# Patient Record
Sex: Female | Born: 1940
Health system: Southern US, Community
[De-identification: ages and names within clinical notes are randomized; demographics above are authoritative.]

## PROBLEM LIST (undated history)

## (undated) DIAGNOSIS — M8580 Other specified disorders of bone density and structure, unspecified site: Secondary | ICD-10-CM

## (undated) DIAGNOSIS — I10 Essential (primary) hypertension: Secondary | ICD-10-CM

## (undated) DIAGNOSIS — A310 Pulmonary mycobacterial infection: Secondary | ICD-10-CM

## (undated) HISTORY — DX: Essential (primary) hypertension: I10

## (undated) HISTORY — DX: Pulmonary mycobacterial infection: A31.0

## (undated) HISTORY — DX: Other specified disorders of bone density and structure, unspecified site: M85.80

---

## 1989-04-24 HISTORY — PX: OTHER SURGICAL HISTORY: SHX169

## 1996-08-24 HISTORY — PX: VESICOVAGINAL FISTULA CLOSURE W/ TAH: SUR271

## 2011-06-12 ENCOUNTER — Encounter: Payer: Self-pay | Admitting: Internal Medicine

## 2011-06-12 ENCOUNTER — Ambulatory Visit (INDEPENDENT_AMBULATORY_CARE_PROVIDER_SITE_OTHER): Payer: Medicare Other | Admitting: Internal Medicine

## 2011-06-12 DIAGNOSIS — A310 Pulmonary mycobacterial infection: Secondary | ICD-10-CM

## 2011-06-12 DIAGNOSIS — J479 Bronchiectasis, uncomplicated: Secondary | ICD-10-CM

## 2011-06-12 NOTE — Progress Notes (Signed)
06/12/11- 78 yoF never smoker here with husband on kind referral by Dr.Nnodi/ [facility].  She she had an episode of cough with hemoptysis in January of 2011 and was diagnosed with Mycobacterium avium. Her pulmonologist in [place] followed her conservatively. There was another brief episode of self-limited hemoptysis in March of 2012. She has had bronchoscopy x2. CT chest without contrast on 02/15/2011 showed areas of reticular nodularity and scarring in the right middle lobe, lingula and right lower lobe, unchanged from prior examination back to 08/27/2009. Bronchiectasis was noted in the right middle lobe and lingula. Stable reticular interstitial opacity is noted in the anterior segment of the right upper lobe. She denies night sweats, fever, weight loss or routine cough. In the absence of ongoing symptoms or radiologic progression, the decision was made to follow without medication. They have moved to [place] to close to family. Past medical history is significant for a couple of episodes of bronchitis with colds and remote history of active reflux requiring treatment but currently inactive. She has never had pneumonia. Treated for high blood pressure. 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 Never a regular smoker but admits smoking a cigarette very occasionally on a social basis.  ROS-see HPI Constitutional:   No-   weight loss, night sweats, fevers, chills, fatigue, lassitude. HEENT:   No-  headaches, difficulty swallowing, tooth/dental problems, sore throat,       No-  sneezing, itching, ear ache, nasal congestion, post nasal drip,  CV:  No-   chest pain, orthopnea, PND, swelling in lower extremities, anasarca, dizziness, palpitations Resp: No-   shortness of breath with exertion or at rest.              No-   productive cough,  No non-productive cough,  No- coughing up of blood.              No-   change in color of mucus.  No-  wheezing.   Skin: No-   rash or lesions. GI:  No-   heartburn, indigestion, abdominal pain, nausea, vomiting, diarrhea,                 change in bowel habits, loss of appetite GU: No-   dysuria, change in color of urine, no urgency or frequency.  No- flank pain. MS:  No-   joint pain or swelling.  No- decreased range of motion.  No- back pain. Neuro-     nothing unusual Psych:  No- change in mood or affect. No depression or anxiety.  No memory loss.  OBJ General- Alert, Oriented, Affect-appropriate, Distress- none acute; medium build Skin- rash-none, lesions- none, excoriation- none Lymphadenopathy- none Head- atraumatic            Eyes- Gross vision intact, PERRLA, conjunctivae clear secretions            Ears- Hearing, canals-normal            Nose- Clear, no-Septal dev, mucus, polyps, erosion, perforation             Throat- Mallampati II , mucosa clear , drainage- none, tonsils- atrophic Neck- flexible , trachea midline, no stridor , thyroid nl, carotid no bruit Chest - symmetrical excursion , unlabored           Heart/CV- RRR , no murmur , no gallop  , no rub, nl s1 s2                           -  JVD- none , edema- none, stasis changes- none, varices- none           Lung- clear to P&A, wheeze- none, cough- none , dullness-none, rub- none           Chest wall-  Abd- tender-no, distended-no, bowel sounds-present, HSM- no Br/ Gen/ Rectal- Not done, not indicated Extrem- cyanosis- none, clubbing, none, atrophy- none, strength- nl Neuro- grossly intact to observation

## 2011-06-12 NOTE — Patient Instructions (Signed)
Order- schedule PFT  We will request your pulmonologist's records from [place]

## 2011-06-14 ENCOUNTER — Encounter: Payer: Self-pay | Admitting: Internal Medicine

## 2011-06-14 DIAGNOSIS — J479 Bronchiectasis, uncomplicated: Secondary | ICD-10-CM | POA: Insufficient documentation

## 2011-06-14 DIAGNOSIS — A310 Pulmonary mycobacterial infection: Secondary | ICD-10-CM | POA: Insufficient documentation

## 2011-06-14 NOTE — Assessment & Plan Note (Addendum)
We will follow conservatively. I recommended pneumonia vaccine but she doesn't remember, so we are getting her prior pulmonology records from [place]. Plan-PFT

## 2011-06-14 NOTE — Assessment & Plan Note (Signed)
I agree with the clinical decision to with hold medication for this condition in the absence of active symptoms or radiologic progression. I discussed this with her and her husband today. We will follow.

## 2011-07-09 ENCOUNTER — Telehealth: Payer: Self-pay | Admitting: Internal Medicine

## 2011-07-09 ENCOUNTER — Ambulatory Visit (INDEPENDENT_AMBULATORY_CARE_PROVIDER_SITE_OTHER): Payer: Medicare Other | Admitting: Internal Medicine

## 2011-07-09 DIAGNOSIS — J479 Bronchiectasis, uncomplicated: Secondary | ICD-10-CM

## 2011-07-09 LAB — PULMONARY FUNCTION TEST

## 2011-07-09 NOTE — Telephone Encounter (Signed)
Received 41 pages from Dr. Coy Saunas [facility] Pulmonary Critical Care. Forwarded to Dr. Maple Hudson for review. 07/09/11-ar

## 2011-07-09 NOTE — Progress Notes (Signed)
PFT done today. 

## 2011-08-31 ENCOUNTER — Other Ambulatory Visit: Payer: Self-pay | Admitting: Family Medicine

## 2011-08-31 DIAGNOSIS — M8580 Other specified disorders of bone density and structure, unspecified site: Secondary | ICD-10-CM

## 2011-08-31 DIAGNOSIS — Z1231 Encounter for screening mammogram for malignant neoplasm of breast: Secondary | ICD-10-CM

## 2011-09-23 ENCOUNTER — Ambulatory Visit
Admission: RE | Admit: 2011-09-23 | Discharge: 2011-09-23 | Disposition: A | Discharge status: Home / Self Care | Payer: Medicare Other | Source: Ambulatory Visit | Attending: Family Medicine | Admitting: Family Medicine

## 2011-09-23 DIAGNOSIS — M8580 Other specified disorders of bone density and structure, unspecified site: Secondary | ICD-10-CM

## 2011-09-23 DIAGNOSIS — Z1231 Encounter for screening mammogram for malignant neoplasm of breast: Secondary | ICD-10-CM

## 2011-09-28 ENCOUNTER — Other Ambulatory Visit: Payer: Self-pay | Admitting: Family Medicine

## 2011-09-28 DIAGNOSIS — R928 Other abnormal and inconclusive findings on diagnostic imaging of breast: Secondary | ICD-10-CM

## 2011-10-07 ENCOUNTER — Ambulatory Visit
Admission: RE | Admit: 2011-10-07 | Discharge: 2011-10-07 | Disposition: A | Discharge status: Home / Self Care | Payer: Medicare Other | Source: Ambulatory Visit | Attending: Family Medicine | Admitting: Family Medicine

## 2011-10-07 DIAGNOSIS — R928 Other abnormal and inconclusive findings on diagnostic imaging of breast: Secondary | ICD-10-CM

## 2011-10-16 ENCOUNTER — Ambulatory Visit: Payer: Medicare Other | Admitting: Internal Medicine

## 2011-11-12 ENCOUNTER — Ambulatory Visit (INDEPENDENT_AMBULATORY_CARE_PROVIDER_SITE_OTHER): Payer: Medicare Other | Admitting: Internal Medicine

## 2011-11-12 ENCOUNTER — Telehealth: Payer: Self-pay | Admitting: Internal Medicine

## 2011-11-12 ENCOUNTER — Ambulatory Visit (INDEPENDENT_AMBULATORY_CARE_PROVIDER_SITE_OTHER)
Admission: RE | Admit: 2011-11-12 | Discharge: 2011-11-12 | Disposition: A | Discharge status: Home / Self Care | Payer: Medicare Other | Source: Ambulatory Visit | Attending: Internal Medicine | Admitting: Internal Medicine

## 2011-11-12 ENCOUNTER — Encounter: Payer: Self-pay | Admitting: Internal Medicine

## 2011-11-12 DIAGNOSIS — J479 Bronchiectasis, uncomplicated: Secondary | ICD-10-CM

## 2011-11-12 DIAGNOSIS — A318 Other mycobacterial infections: Secondary | ICD-10-CM

## 2011-11-12 DIAGNOSIS — A310 Pulmonary mycobacterial infection: Secondary | ICD-10-CM

## 2011-11-12 NOTE — Progress Notes (Signed)
06/12/11- 55 yoF never smoker here with husband on kind referral by Dr.Nnodi/ [facility].  She she had an episode of cough with hemoptysis in January of 2011 and was diagnosed with Mycobacterium avium. Her pulmonologist in [place] followed her conservatively. There was another brief episode of self-limited hemoptysis in March of 2012. She has had bronchoscopy x2. CT chest without contrast on 02/15/2011 showed areas of reticular nodularity and scarring in the right middle lobe, lingula and right lower lobe, unchanged from prior examination back to 08/27/2009. Bronchiectasis was noted in the right middle lobe and lingula. Stable reticular interstitial opacity is noted in the anterior segment of the right upper lobe. She denies night sweats, fever, weight loss or routine cough. In the absence of ongoing symptoms or radiologic progression, the decision was made to follow without medication. They have moved to [place] to close to family. Past medical history is significant for a couple of episodes of bronchitis with colds and remote history of active reflux requiring treatment but currently inactive. She has never had pneumonia. Treated for high blood pressure. 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 Never a regular smoker but admits smoking a cigarette very occasionally on a social basis.  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 Had a flulike illness treated with a Z-Pak. Currently has no routine cough. Admits occasional night sweats but can't tell they are abnormal. No recent changes-no sputum, chest pain, or swollen nodes. 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 Air-trapping confirming obstruction. Normal diffusion. FEV1 1.84/89%, FEV1/FVC 0.71, FEF 25-75% 1.06/45% after bronchodilator.   ROS-see HPI Constitutional:   No-   weight loss, +night sweats,   No-fevers, chills, fatigue, lassitude. HEENT:   No-  headaches, difficulty swallowing, tooth/dental problems, sore throat,       No-  sneezing, itching, ear ache, nasal congestion, post nasal drip,  CV:  No-   chest pain, orthopnea, PND, swelling in lower extremities, anasarca, dizziness, palpitations Resp: No-   shortness of breath with exertion or at rest.              No-   productive cough,  No non-productive cough,  No- coughing up of blood.              No-   change in color of mucus.  No- wheezing.   Skin: No-   rash or lesions. GI:  No-   heartburn, indigestion, abdominal pain, nausea, vomiting,  GU: MS:  No-   joint pain or swelling.  Neuro-     nothing unusual Psych:  No- change in mood or affect. No depression or anxiety.  No memory loss.  OBJ General- Alert, Oriented, Affect-appropriate, Distress- none acute; medium build, well-appearing Skin- rash-none, lesions- none, excoriation- none Lymphadenopathy- none Head- atraumatic            Eyes- Gross vision intact, PERRLA, conjunctivae clear secretions            Ears- Hearing, canals-normal            Nose- Clear, no-Septal dev, mucus, polyps, erosion, perforation             Throat- Mallampati II , mucosa clear , drainage- none, tonsils- atrophic Neck- flexible , trachea midline, no stridor , thyroid nl, carotid no bruit Chest - symmetrical excursion , unlabored           Heart/CV-  RRR , no murmur , no gallop  , no rub, nl s1 s2                           - JVD- none , edema- none, stasis changes- none, varices- none           Lung- clear to P&A, wheeze- none, cough- none , dullness-none, rub- none           Chest wall-  Abd-  Br/ Gen/ Rectal- Not done, not indicated Extrem- cyanosis- none, clubbing, none, atrophy- none, strength- nl Neuro- grossly intact to observation

## 2011-11-12 NOTE — Patient Instructions (Signed)
Order- CXR  Dx bronchiectasis/ MAIC  Please call as needed

## 2011-11-15 NOTE — Assessment & Plan Note (Signed)
She is currently asymptomatic. Plan-no changes needed.

## 2011-11-15 NOTE — Assessment & Plan Note (Signed)
Plan-chest x-ray. We can follow it long intervals.

## 2011-11-16 NOTE — Telephone Encounter (Signed)
Spoke with Florentina Addison , CY's nurse and ov note was forwarded to Dr Ihor Dow.

## 2011-11-18 ENCOUNTER — Telehealth: Payer: Self-pay | Admitting: Internal Medicine

## 2011-11-18 NOTE — Telephone Encounter (Signed)
I spoke with patient about results and she verbalized understanding and had no questions 

## 2012-11-11 ENCOUNTER — Ambulatory Visit (INDEPENDENT_AMBULATORY_CARE_PROVIDER_SITE_OTHER)
Admission: RE | Admit: 2012-11-11 | Discharge: 2012-11-11 | Disposition: A | Discharge status: Home / Self Care | Payer: Medicare Other | Source: Ambulatory Visit | Attending: Internal Medicine | Admitting: Internal Medicine

## 2012-11-11 ENCOUNTER — Encounter: Payer: Self-pay | Admitting: Internal Medicine

## 2012-11-11 ENCOUNTER — Ambulatory Visit (INDEPENDENT_AMBULATORY_CARE_PROVIDER_SITE_OTHER): Payer: Medicare Other | Admitting: Internal Medicine

## 2012-11-11 VITALS — BP 116/74 | HR 61 | Ht 64.0 in | Wt 166.6 lb

## 2012-11-11 DIAGNOSIS — J479 Bronchiectasis, uncomplicated: Secondary | ICD-10-CM

## 2012-11-11 DIAGNOSIS — A310 Pulmonary mycobacterial infection: Secondary | ICD-10-CM

## 2012-11-11 NOTE — Patient Instructions (Addendum)
Order- CXR  Dx bronchiectasis, hx MAIC  Please call as needed

## 2012-11-11 NOTE — Progress Notes (Signed)
06/12/11- 5 yoF never smoker here with husband on kind referral by Dr.Nnodi/ [facility].  She she had an episode of cough with hemoptysis in January of 2011 and was diagnosed with Mycobacterium avium. Her pulmonologist in [place] followed her conservatively. There was another brief episode of self-limited hemoptysis in March of 2012. She has had bronchoscopy x2. CT chest without contrast on 02/15/2011 showed areas of reticular nodularity and scarring in the right middle lobe, lingula and right lower lobe, unchanged from prior examination back to 08/27/2009. Bronchiectasis was noted in the right middle lobe and lingula. Stable reticular interstitial opacity is noted in the anterior segment of the right upper lobe. She denies night sweats, fever, weight loss or routine cough. In the absence of ongoing symptoms or radiologic progression, the decision was made to follow without medication. They have moved to [place] to close to family. Past medical history is significant for a couple of episodes of bronchitis with colds and remote history of active reflux requiring treatment but currently inactive. She has never had pneumonia. Treated for high blood pressure. 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 Never a regular smoker but admits smoking a cigarette very occasionally on a social basis.  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 Had a flulike illness treated with a Z-Pak. Currently has no routine cough. Admits occasional night sweats but can't tell they are abnormal. No recent changes-no sputum, chest pain, or swollen nodes. 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 Air-trapping confirming obstruction. Normal diffusion. FEV1 1.84/89%, FEV1/FVC 0.71, FEF 25-75% 1.06/45% after bronchodilator.   11/11/12-  32 yoF never smoker with MAIC, bronchiectasis, hemoptysis   PCP Dr Amanda Sims FOLLOWS FOR: denies any SOB or wheezing since last visit.  Husband here She was never treated for [facility] by her [place] Dr. Since last here a year ago she has had no exacerbations. Occasional night sweats his blamed on hormones. She feels well without cough or phlegm. We had a discussion of pneumonia vaccine. CXR 11/18/11- reviewed with her IMPRESSION:  Enlargement cardiac silhouette. Generalized hyperinflation  configuration consistent with element of COPD. No pulmonary edema  or consolidation. No pleural effusion. Noncalcific apical pleural  thickening.  Original Report Authenticated By: Crawford Givens, M.D.  ROS-see HPI Constitutional:   No-   weight loss, +night sweats,  No-fevers, chills, fatigue, lassitude. HEENT:   No-  headaches, difficulty swallowing, tooth/dental problems, sore throat,       No-  sneezing, itching, ear ache, nasal congestion, post nasal drip,  CV:  No-   chest pain, orthopnea, PND, swelling in lower extremities, anasarca, dizziness, palpitations Resp: No-   shortness of breath with exertion or at rest.              No-   productive cough,  No non-productive cough,  No- coughing up of blood.              No-   change in color of mucus.  No- wheezing.   Skin: No-   rash or lesions. GI:  No-   heartburn, indigestion, abdominal pain, nausea, vomiting,  GU: MS:  No-   joint pain or swelling.  Neuro-     nothing unusual Psych:  No- change in mood or affect. No depression or anxiety.  No memory loss.  OBJ General- Alert, Oriented, Affect-appropriate, Distress- none acute; medium build, well-appearing. Exam is unchanged. Skin-  rash-none, lesions- none, excoriation- none Lymphadenopathy- none Head- atraumatic            Eyes- Gross vision intact, PERRLA, conjunctivae clear secretions            Ears- Hearing, canals-normal            Nose- Clear, no-Septal dev, mucus, polyps, erosion, perforation             Throat- Mallampati II , mucosa clear , drainage-  none, tonsils- atrophic Neck- flexible , trachea midline, no stridor , thyroid nl, carotid no bruit Chest - symmetrical excursion , unlabored           Heart/CV- RRR , no murmur , no gallop  , no rub, nl s1 s2                           - JVD- none , edema- none, stasis changes- none, varices- none           Lung- clear to P&A, wheeze- none, cough- none , dullness-none, rub- none           Chest wall-  Abd-  Br/ Gen/ Rectal- Not done, not indicated Extrem- cyanosis- none, clubbing, none, atrophy- none, strength- nl Neuro- grossly intact to observation

## 2012-11-15 ENCOUNTER — Telehealth: Payer: Self-pay | Admitting: Internal Medicine

## 2012-11-15 NOTE — Telephone Encounter (Signed)
Pt made aware of results of CXR. Nothing further needed.

## 2012-11-15 NOTE — Progress Notes (Signed)
Quick Note:  Pt aware of results. ______ 

## 2012-11-17 ENCOUNTER — Other Ambulatory Visit: Payer: Self-pay

## 2012-11-17 DIAGNOSIS — Z1231 Encounter for screening mammogram for malignant neoplasm of breast: Secondary | ICD-10-CM

## 2012-11-18 NOTE — Assessment & Plan Note (Signed)
We will watch chest x-ray at long intervals but she seems to be doing quite well with little indication for active infection.

## 2012-11-18 NOTE — Assessment & Plan Note (Signed)
She never smoked but there is a chronic obstructive/COPD pattern seen on PFT and chest x-ray

## 2013-01-04 ENCOUNTER — Ambulatory Visit
Admission: RE | Admit: 2013-01-04 | Discharge: 2013-01-04 | Disposition: A | Discharge status: Home / Self Care | Payer: Medicare Other | Source: Ambulatory Visit

## 2013-01-04 DIAGNOSIS — Z1231 Encounter for screening mammogram for malignant neoplasm of breast: Secondary | ICD-10-CM

## 2013-09-06 ENCOUNTER — Other Ambulatory Visit: Payer: Self-pay | Admitting: Family Medicine

## 2013-09-06 DIAGNOSIS — M8580 Other specified disorders of bone density and structure, unspecified site: Secondary | ICD-10-CM

## 2013-09-06 DIAGNOSIS — Z1231 Encounter for screening mammogram for malignant neoplasm of breast: Secondary | ICD-10-CM

## 2013-09-26 ENCOUNTER — Ambulatory Visit
Admission: RE | Admit: 2013-09-26 | Discharge: 2013-09-26 | Disposition: A | Discharge status: Home / Self Care | Payer: Self-pay | Source: Ambulatory Visit | Attending: Family Medicine | Admitting: Family Medicine

## 2013-09-26 DIAGNOSIS — M8580 Other specified disorders of bone density and structure, unspecified site: Secondary | ICD-10-CM

## 2013-11-17 ENCOUNTER — Ambulatory Visit (INDEPENDENT_AMBULATORY_CARE_PROVIDER_SITE_OTHER)
Admission: RE | Admit: 2013-11-17 | Discharge: 2013-11-17 | Disposition: A | Discharge status: Home / Self Care | Payer: Medicare Other | Source: Ambulatory Visit | Attending: Internal Medicine | Admitting: Internal Medicine

## 2013-11-17 ENCOUNTER — Encounter: Payer: Self-pay | Admitting: Internal Medicine

## 2013-11-17 ENCOUNTER — Ambulatory Visit (INDEPENDENT_AMBULATORY_CARE_PROVIDER_SITE_OTHER): Payer: Medicare Other | Admitting: Internal Medicine

## 2013-11-17 ENCOUNTER — Encounter (INDEPENDENT_AMBULATORY_CARE_PROVIDER_SITE_OTHER): Payer: Self-pay

## 2013-11-17 VITALS — BP 118/72 | HR 55 | Ht 64.0 in | Wt 172.4 lb

## 2013-11-17 DIAGNOSIS — A318 Other mycobacterial infections: Secondary | ICD-10-CM

## 2013-11-17 DIAGNOSIS — R0609 Other forms of dyspnea: Secondary | ICD-10-CM

## 2013-11-17 DIAGNOSIS — R0989 Other specified symptoms and signs involving the circulatory and respiratory systems: Secondary | ICD-10-CM

## 2013-11-17 DIAGNOSIS — Z23 Encounter for immunization: Secondary | ICD-10-CM

## 2013-11-17 DIAGNOSIS — A310 Pulmonary mycobacterial infection: Secondary | ICD-10-CM

## 2013-11-17 DIAGNOSIS — J479 Bronchiectasis, uncomplicated: Secondary | ICD-10-CM

## 2013-11-17 NOTE — Progress Notes (Signed)
06/12/11- 66 yoF never smoker here with husband on kind referral by Dr.Nnodi/ [facility].  She she had an episode of cough with hemoptysis in January of 2011 and was diagnosed with Mycobacterium avium. Her pulmonologist in [place] followed her conservatively. There was another brief episode of self-limited hemoptysis in March of 2012. She has had bronchoscopy x2. CT chest without contrast on 02/15/2011 showed areas of reticular nodularity and scarring in the right middle lobe, lingula and right lower lobe, unchanged from prior examination back to 08/27/2009. Bronchiectasis was noted in the right middle lobe and lingula. Stable reticular interstitial opacity is noted in the anterior segment of the right upper lobe. She denies night sweats, fever, weight loss or routine cough. In the absence of ongoing symptoms or radiologic progression, the decision was made to follow without medication. They have moved to [place] to close to family. Past medical history is significant for a couple of episodes of bronchitis with colds and remote history of active reflux requiring treatment but currently inactive. She has never had pneumonia. Treated for high blood pressure. 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 Has had flu vaccine. Never a regular smoker but admits smoking a cigarette very occasionally on a social basis.  11/12/11 79 yoF with MAIC, bronchiectasis, hemoptysis  PCP Dr Orland Penman Husband here. Had a flulike illness treated with a Z-Pak. Currently has no routine cough. Admits occasional night sweats but can't tell they are abnormal. No recent changes-no sputum, chest pain, or swollen nodes. 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 Air-trapping confirming obstruction. Normal diffusion. FEV1 1.84/89%, FEV1/FVC 0.71, FEF 25-75% 1.06/45% after bronchodilator.   11/11/12-  42 yoF never smoker with MAIC, bronchiectasis, hemoptysis   PCP Dr Orland Penman FOLLOWS FOR: denies any SOB or wheezing since last visit.  Husband here She was never treated for [facility] by her [place] Dr. Since last here a year ago she has had no exacerbations. Occasional night sweats his blamed on hormones. She feels well without cough or phlegm. We had a discussion of pneumonia vaccine. CXR 11/18/11- reviewed with her IMPRESSION:  Enlargement cardiac silhouette. Generalized hyperinflation  configuration consistent with element of COPD. No pulmonary edema  or consolidation. No pleural effusion. Noncalcific apical pleural  thickening.  Original Report Authenticated By: Delane Ginger, M.D.  11/17/13- 46 yoF never smoker with MAIC, bronchiectasis, hemoptysis  PCP Dr Orland Penman FOLLOWS FOR: Pt states her breathing is about the same-goes up slight incline at church then steps she notices that she has harder time breathing. Asks about Prevnar. Dyspnea on exertion hills and stairs, one pillow. Recent night sweats without cough or edema. Never again hemoptysis. CXR 11/15/12 IMPRESSION:  Stable cardiopulmonary appearance with unchanged mild cardiac  enlargement.  Original Report Authenticated By: Ponciano Ort, M.D  ROS-see HPI Constitutional:   No-   weight loss, +night sweats,  No-fevers, chills, fatigue, lassitude. HEENT:   No-  headaches, difficulty swallowing, tooth/dental problems, sore throat,       No-  sneezing, itching, ear ache, nasal congestion, post nasal drip,  CV:  No-   chest pain, orthopnea, PND, swelling in lower extremities, anasarca, dizziness, palpitations Resp: +shortness of breath with exertion or at rest.              No-   productive cough,  No non-productive cough,  No- coughing up of blood.              No-   change  in color of mucus.  No- wheezing.   Skin: No-   rash or lesions. GI:  No-   heartburn, indigestion, abdominal pain, nausea, vomiting,  GU: MS:  No-   joint pain or swelling.  Neuro-     nothing unusual Psych:  No- change in mood  or affect. No depression or anxiety.  No memory loss.  OBJ General- Alert, Oriented, Affect-appropriate, Distress- none acute; medium build, well-appearing. Exam is unchanged. Skin- rash-none, lesions- none, excoriation- none Lymphadenopathy- none Head- atraumatic            Eyes- Gross vision intact, PERRLA, conjunctivae clear secretions            Ears- Hearing, canals-normal            Nose- Clear, no-Septal dev, mucus, polyps, erosion, perforation             Throat- Mallampati II , mucosa clear , drainage- none, tonsils- atrophic Neck- flexible , trachea midline, no stridor , thyroid nl, carotid no bruit Chest - symmetrical excursion , unlabored           Heart/CV- RRR , no murmur , no gallop  , no rub, nl s1 s2                           - JVD- none , edema- none, stasis changes- none, varices- none           Lung- clear to P&A, wheeze- none, cough- none , dullness-none, rub- none           Chest wall-  Abd-  Br/ Gen/ Rectal- Not done, not indicated Extrem- cyanosis- none, clubbing, none, atrophy- none, strength- nl Neuro- grossly intact to observation

## 2013-11-17 NOTE — Patient Instructions (Addendum)
Order- CXR  Dx hx MAIC, dyspnea with exertion  Ok to have Prevnar pneumococcal 13 vaccine  Please call as needed

## 2013-12-15 NOTE — Assessment & Plan Note (Signed)
Encourage exercise for stamina Plan-Prevnar vaccine

## 2013-12-15 NOTE — Assessment & Plan Note (Signed)
Doubt activity plan-chest x-ray

## 2014-01-18 ENCOUNTER — Ambulatory Visit
Admission: RE | Admit: 2014-01-18 | Discharge: 2014-01-18 | Disposition: A | Discharge status: Home / Self Care | Payer: Medicare Other | Source: Ambulatory Visit | Attending: Family Medicine | Admitting: Family Medicine

## 2014-01-18 ENCOUNTER — Encounter (INDEPENDENT_AMBULATORY_CARE_PROVIDER_SITE_OTHER): Payer: Self-pay

## 2014-01-18 DIAGNOSIS — Z1231 Encounter for screening mammogram for malignant neoplasm of breast: Secondary | ICD-10-CM

## 2014-11-22 ENCOUNTER — Ambulatory Visit (INDEPENDENT_AMBULATORY_CARE_PROVIDER_SITE_OTHER): Payer: Medicare Other | Admitting: Internal Medicine

## 2014-11-22 ENCOUNTER — Encounter: Payer: Self-pay | Admitting: Internal Medicine

## 2014-11-22 ENCOUNTER — Encounter (INDEPENDENT_AMBULATORY_CARE_PROVIDER_SITE_OTHER): Payer: Self-pay

## 2014-11-22 ENCOUNTER — Ambulatory Visit (INDEPENDENT_AMBULATORY_CARE_PROVIDER_SITE_OTHER)
Admission: RE | Admit: 2014-11-22 | Discharge: 2014-11-22 | Disposition: A | Discharge status: Home / Self Care | Payer: Medicare Other | Source: Ambulatory Visit | Attending: Internal Medicine | Admitting: Internal Medicine

## 2014-11-22 VITALS — BP 118/66 | HR 60 | Ht 64.0 in | Wt 178.0 lb

## 2014-11-22 DIAGNOSIS — J479 Bronchiectasis, uncomplicated: Secondary | ICD-10-CM | POA: Diagnosis not present

## 2014-11-22 DIAGNOSIS — A310 Pulmonary mycobacterial infection: Secondary | ICD-10-CM

## 2014-11-22 NOTE — Assessment & Plan Note (Signed)
She is not noting significant cough. Occ throat lozenge sufficient. If CXR stable, I suggested she see [place] again prn and let her PCP follow.

## 2014-11-22 NOTE — Patient Instructions (Signed)
Order- CXR    Dx bronchiectasis w/o exacerbation, MAIC  Please call as needed

## 2014-11-22 NOTE — Progress Notes (Signed)
06/12/11- 50 yoF never smoker here with husband on kind referral by Dr.Nnodi/ [facility].  She she had an episode of cough with hemoptysis in January of 2011 and was diagnosed with Mycobacterium avium. Her pulmonologist in [place] followed her conservatively. There was another brief episode of self-limited hemoptysis in March of 2012. She has had bronchoscopy x2. CT chest without contrast on 02/15/2011 showed areas of reticular nodularity and scarring in the right middle lobe, lingula and right lower lobe, unchanged from prior examination back to 08/27/2009. Bronchiectasis was noted in the right middle lobe and lingula. Stable reticular interstitial opacity is noted in the anterior segment of the right upper lobe. She denies night sweats, fever, weight loss or routine cough. In the absence of ongoing symptoms or radiologic progression, the decision was made to follow without medication. They have moved to [place] to close to family. Past medical history is significant for a couple of episodes of bronchitis with colds and remote history of active reflux requiring treatment but currently inactive. She has never had pneumonia. Treated for high blood pressure. 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 Has had flu vaccine. Never a regular smoker but admits smoking a cigarette very occasionally on a social basis.  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 Had a flulike illness treated with a Z-Pak. Currently has no routine cough. Admits occasional night sweats but can't tell they are abnormal. No recent changes-no sputum, chest pain, or swollen nodes. 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 Air-trapping confirming obstruction. Normal diffusion. FEV1 1.84/89%, FEV1/FVC 0.71, FEF 25-75% 1.06/45% after bronchodilator.   11/11/12-  37 yoF never smoker with MAIC, bronchiectasis, hemoptysis   PCP Dr Orland Penman FOLLOWS FOR: denies any SOB or wheezing since last visit.  Husband here She was never treated for [facility] by her [place] Dr. Since last here a year ago she has had no exacerbations. Occasional night sweats his blamed on hormones. She feels well without cough or phlegm. We had a discussion of pneumonia vaccine. CXR 11/18/11- reviewed with her IMPRESSION:  Enlargement cardiac silhouette. Generalized hyperinflation  configuration consistent with element of COPD. No pulmonary edema  or consolidation. No pleural effusion. Noncalcific apical pleural  thickening.  Original Report Authenticated By: Delane Ginger, M.D.  11/17/13- 80 yoF never smoker with MAIC, bronchiectasis, hemoptysis  PCP Dr Orland Penman FOLLOWS FOR: Pt states her breathing is about the same-goes up slight incline at church then steps she notices that she has harder time breathing. Asks about Prevnar. Dyspnea on exertion hills and stairs, one pillow. Recent night sweats without cough or edema. Never again hemoptysis. CXR 11/15/12 IMPRESSION:  Stable cardiopulmonary appearance with unchanged mild cardiac  enlargement.  Original Report Authenticated By: Ponciano Ort, M.D  11/22/14- 33 yoF never smoker with MAIC, bronchiectasis, hemoptysis, DOE  PCP Dr Orland Penman Follows:SOB w/incline; feels "good" per pt  PFT 2012 mild obstruction. Denies cough, night sweats, wheeze. No real change. CXR 11/17/13- image reviewed IMPRESSION: 1. Stable cardiomegaly. No CHF. 2. COPD and pleural parenchymal scarring. Electronically Signed  By: Marcello Moores Register  On: 11/17/2013 15:22  ROS-see HPI Constitutional:   No-   weight loss, +night sweats,  No-fevers, chills, fatigue, lassitude. HEENT:   No-  headaches, difficulty swallowing, tooth/dental problems, sore throat,       No-  sneezing, itching, ear ache, nasal congestion, post nasal drip,  CV:  No-   chest pain, orthopnea, PND, swelling  in lower extremities, anasarca, dizziness,  palpitations Resp: +shortness of breath with exertion or at rest.              No-   productive cough,  No non-productive cough,  No- coughing up of blood.              No-   change in color of mucus.  No- wheezing.   Skin: No-   rash or lesions. GI:  No-   heartburn, indigestion, abdominal pain, nausea, vomiting,  GU: MS:  No-   joint pain or swelling.  Neuro-     nothing unusual Psych:  No- change in mood or affect. No depression or anxiety.  No memory loss.  OBJ General- Alert, Oriented, Affect-appropriate, Distress- none acute; medium build, well-appearing. Exam is unchanged.   Husband here Skin- rash-none, lesions- none, excoriation- none Lymphadenopathy- none Head- atraumatic            Eyes- Gross vision intact, PERRLA, conjunctivae clear secretions            Ears- Hearing, canals-normal            Nose- Clear, no-Septal dev, mucus, polyps, erosion, perforation             Throat- Mallampati II , mucosa clear , drainage- none, tonsils- atrophic Neck- flexible , trachea midline, no stridor , thyroid nl, carotid no bruit Chest - symmetrical excursion , unlabored           Heart/CV- RRR , no murmur , no gallop  , no rub, nl s1 s2                           - JVD- none , edema- none, stasis changes- none, varices- none           Lung- clear to P&A, wheeze- none, cough- none , dullness-none, rub- none           Chest wall-  Abd-  Br/ Gen/ Rectal- Not done, not indicated Extrem- cyanosis- none, clubbing, none, atrophy- none, strength- nl Neuro- grossly intact to observation

## 2014-11-22 NOTE — Assessment & Plan Note (Addendum)
Not symptomatic of active disease now. Plan- CXR annually and prn. Her choice to f/u here or with PCP

## 2015-01-22 ENCOUNTER — Other Ambulatory Visit: Payer: Self-pay

## 2015-01-22 DIAGNOSIS — Z1231 Encounter for screening mammogram for malignant neoplasm of breast: Secondary | ICD-10-CM

## 2015-02-28 ENCOUNTER — Ambulatory Visit
Admission: RE | Admit: 2015-02-28 | Discharge: 2015-02-28 | Disposition: A | Discharge status: Home / Self Care | Payer: Medicare Other | Source: Ambulatory Visit

## 2015-02-28 DIAGNOSIS — Z1231 Encounter for screening mammogram for malignant neoplasm of breast: Secondary | ICD-10-CM

## 2015-11-22 ENCOUNTER — Telehealth: Payer: Self-pay | Admitting: Internal Medicine

## 2015-11-22 ENCOUNTER — Encounter: Payer: Self-pay | Admitting: Internal Medicine

## 2015-11-22 ENCOUNTER — Ambulatory Visit (INDEPENDENT_AMBULATORY_CARE_PROVIDER_SITE_OTHER): Payer: Medicare Other | Admitting: Internal Medicine

## 2015-11-22 ENCOUNTER — Ambulatory Visit (INDEPENDENT_AMBULATORY_CARE_PROVIDER_SITE_OTHER)
Admission: RE | Admit: 2015-11-22 | Discharge: 2015-11-22 | Disposition: A | Discharge status: Home / Self Care | Payer: Medicare Other | Source: Ambulatory Visit | Attending: Internal Medicine | Admitting: Internal Medicine

## 2015-11-22 VITALS — BP 112/68 | HR 78 | Ht 64.0 in | Wt 184.0 lb

## 2015-11-22 DIAGNOSIS — A310 Pulmonary mycobacterial infection: Secondary | ICD-10-CM

## 2015-11-22 DIAGNOSIS — R0600 Dyspnea, unspecified: Secondary | ICD-10-CM | POA: Diagnosis not present

## 2015-11-22 DIAGNOSIS — J479 Bronchiectasis, uncomplicated: Secondary | ICD-10-CM | POA: Diagnosis not present

## 2015-11-22 DIAGNOSIS — E669 Obesity, unspecified: Secondary | ICD-10-CM | POA: Diagnosis not present

## 2015-11-22 NOTE — Assessment & Plan Note (Signed)
She is overweight to observation and this likely contributes, with deconditioning, to her dyspnea on exertion. Plan-discussed some conservative exercise opportunities in town

## 2015-11-22 NOTE — Progress Notes (Signed)
06/12/11- 7 yoF never smoker here with husband on kind referral by Dr.Nnodi/ [facility].  She she had an episode of cough with hemoptysis in January of 2011 and was diagnosed with Mycobacterium avium. Her pulmonologist in [place] followed her conservatively. There was another brief episode of self-limited hemoptysis in March of 2012. She has had bronchoscopy x2. CT chest without contrast on 02/15/2011 showed areas of reticular nodularity and scarring in the right middle lobe, lingula and right lower lobe, unchanged from prior examination back to 08/27/2009. Bronchiectasis was noted in the right middle lobe and lingula. Stable reticular interstitial opacity is noted in the anterior segment of the right upper lobe. She denies night sweats, fever, weight loss or routine cough. In the absence of ongoing symptoms or radiologic progression, the decision was made to follow without medication. They have moved to [place] to close to family. Past medical history is significant for a couple of episodes of bronchitis with colds and remote history of active reflux requiring treatment but currently inactive. She has never had pneumonia. Treated for high blood pressure. 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 Has had flu vaccine. Never a regular smoker but admits smoking a cigarette very occasionally on a social basis.  11/12/11 78 yoF with MAIC, bronchiectasis, hemoptysis  PCP Dr Orland Penman Husband here. Had a flulike illness treated with a Z-Pak. Currently has no routine cough. Admits occasional night sweats but can't tell they are abnormal. No recent changes-no sputum, chest pain, or swollen nodes. 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 Air-trapping confirming obstruction. Normal diffusion. FEV1 1.84/89%, FEV1/FVC 0.71, FEF 25-75% 1.06/45% after bronchodilator.   11/11/12-  78 yoF never smoker with MAIC, bronchiectasis, hemoptysis   PCP Dr Orland Penman FOLLOWS FOR: denies any SOB or wheezing since last visit.  Husband here She was never treated for [facility] by her [place] Dr. Since last here a year ago she has had no exacerbations. Occasional night sweats his blamed on hormones. She feels well without cough or phlegm. We had a discussion of pneumonia vaccine. CXR 11/18/11- reviewed with her IMPRESSION:  Enlargement cardiac silhouette. Generalized hyperinflation  configuration consistent with element of COPD. No pulmonary edema  or consolidation. No pleural effusion. Noncalcific apical pleural  thickening.  Original Report Authenticated By: Delane Ginger, M.D.  11/17/13- 81 yoF never smoker with MAIC, bronchiectasis, hemoptysis  PCP Dr Orland Penman FOLLOWS FOR: Pt states her breathing is about the same-goes up slight incline at church then steps she notices that she has harder time breathing. Asks about Prevnar. Dyspnea on exertion hills and stairs, one pillow. Recent night sweats without cough or edema. Never again hemoptysis. CXR 11/15/12 IMPRESSION:  Stable cardiopulmonary appearance with unchanged mild cardiac  enlargement.  Original Report Authenticated By: Ponciano Ort, M.D  11/22/14- 41 yoF never smoker with MAIC, bronchiectasis, hemoptysis, DOE  PCP Dr Orland Penman Follows:SOB w/incline; feels "good" per pt  PFT 2012 mild obstruction. Denies cough, night sweats, wheeze. No real change. CXR 11/17/13- image reviewed IMPRESSION: 1. Stable cardiomegaly. No CHF. 2. COPD and pleural parenchymal scarring. Electronically Signed  By: Marcello Moores Register  On: 11/17/2013 15:22  11/22/2015-75 year old female never smoker with MAIC, bronchiectasis, hemoptysis, DOE Follows for: Bronchiectasis, MAIC. Pt c/o continued SOB with exertion. Pt denies cough/wheeze/CP/tightness.  She is aware of some dyspnea on exertion climbing a steep inclined to get into church or if climbing multiple stairs. Little wheeze or cough unless she has a cold. Occasional  night sweat  without fever, adenopathy or rash. CXR 11/22/2014 IMPRESSION: Mild cardiomegaly and bibasilar scarring. No acute superimposed process. Electronically Signed  By: Abigail Miyamoto M.D.  On: 11/22/2014 16:32 Office Spirometry 11/22/2015-mild obstructive airways disease. FVC 2.14/76%, FEV1 1.49/70%, FEV1/FVC 0.70, FEF 25-75 percent 0.85/46%.  ROS-see HPI Constitutional:   No-   weight loss, +night sweats,  No-fevers, chills, fatigue, lassitude. HEENT:   No-  headaches, difficulty swallowing, tooth/dental problems, sore throat,       No-  sneezing, itching, ear ache, nasal congestion, post nasal drip,  CV:  No-   chest pain, orthopnea, PND, swelling in lower extremities, anasarca, dizziness, palpitations Resp: +shortness of breath with exertion or at rest.              No-   productive cough,  No non-productive cough,  No- coughing up of blood.              No-   change in color of mucus.  No- wheezing.   Skin: No-   rash or lesions. GI:  No-   heartburn, indigestion, abdominal pain, nausea, vomiting,  GU: MS:  No-   joint pain or swelling.  Neuro-     nothing unusual Psych:  No- change in mood or affect. No depression or anxiety.  No memory loss.  OBJ General- Alert, Oriented, Affect-appropriate, Distress- none acute; + obese, well-appearing.    Husband here Skin- rash-none, lesions- none, excoriation- none Lymphadenopathy- none Head- atraumatic            Eyes- Gross vision intact, PERRLA, conjunctivae clear secretions            Ears- Hearing, canals-normal            Nose- Clear, no-Septal dev, mucus, polyps, erosion, perforation             Throat- Mallampati II , mucosa clear , drainage- none, tonsils- atrophic Neck- flexible , trachea midline, no stridor , thyroid nl, carotid no bruit Chest - symmetrical excursion , unlabored           Heart/CV- RRR , no murmur , no gallop  , no rub, nl s1 s2                           - JVD- none , edema- none, stasis changes- none,  varices- none           Lung- clear to P&A, wheeze- none, cough- none , dullness-none, rub- none           Chest wall-  Abd-  Br/ Gen/ Rectal- Not done, not indicated Extrem- cyanosis- none, clubbing, none, atrophy- none, strength- nl Neuro- grossly intact to observation

## 2015-11-22 NOTE — Assessment & Plan Note (Signed)
She is not offering symptoms other than night sweats, suggestive of active disease Plan-chest x-ray

## 2015-11-22 NOTE — Patient Instructions (Addendum)
Order- office spirometry  Dx dyspnea on exertion  Order- CXR-  Dx MAIC, bronchiectasis  Please call if we can help

## 2015-11-22 NOTE — Telephone Encounter (Signed)
Notes Recorded by Deneise Lever, MD on 11/22/2015 at 2:22 PM CXR- stable old scarring with no sign of active inflammation  I spoke with patient about results and she verbalized understanding and had no questions

## 2015-11-22 NOTE — Assessment & Plan Note (Signed)
Little cough or sputum production. Plan-update chest x-ray

## 2015-11-22 NOTE — Progress Notes (Signed)
Quick Note:  lmtcb for pt. ______ 

## 2016-01-01 ENCOUNTER — Other Ambulatory Visit: Payer: Self-pay | Admitting: Family Medicine

## 2016-01-01 DIAGNOSIS — E559 Vitamin D deficiency, unspecified: Secondary | ICD-10-CM

## 2016-01-16 ENCOUNTER — Other Ambulatory Visit: Payer: Self-pay | Admitting: Family Medicine

## 2016-01-16 DIAGNOSIS — E559 Vitamin D deficiency, unspecified: Secondary | ICD-10-CM

## 2016-01-16 DIAGNOSIS — M8580 Other specified disorders of bone density and structure, unspecified site: Secondary | ICD-10-CM

## 2016-01-21 ENCOUNTER — Ambulatory Visit
Admission: RE | Admit: 2016-01-21 | Discharge: 2016-01-21 | Disposition: A | Discharge status: Home / Self Care | Payer: Medicare Other | Source: Ambulatory Visit | Attending: Family Medicine | Admitting: Family Medicine

## 2016-01-21 DIAGNOSIS — M8580 Other specified disorders of bone density and structure, unspecified site: Secondary | ICD-10-CM

## 2016-01-21 DIAGNOSIS — E559 Vitamin D deficiency, unspecified: Secondary | ICD-10-CM

## 2016-03-05 IMAGING — CR DG CHEST 2V
2 series · 2 of 2 positions shown · non-contrast
Comparison: 11/17/2013

CLINICAL DATA: Followup of mycobacterium avium.  Bronchiectasis.

EXAM:
CHEST  2 VIEW

[view not recorded (1 of 2)]
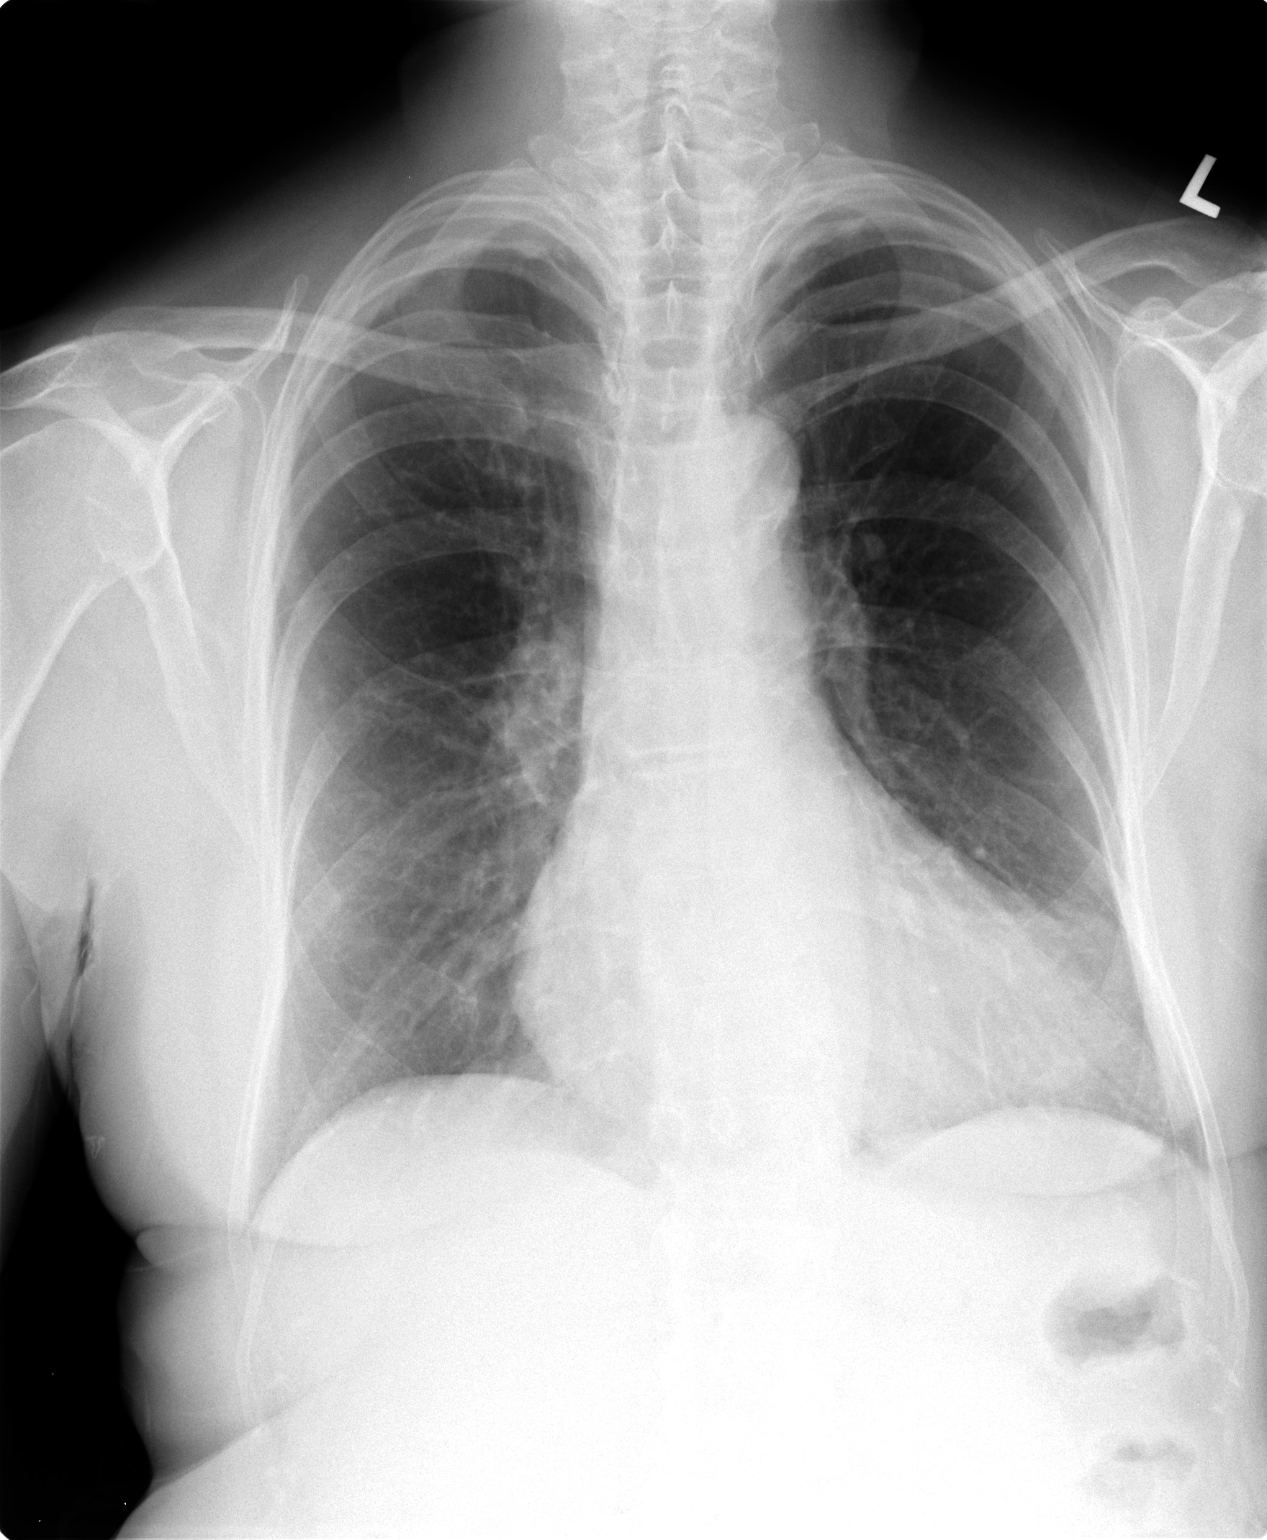

[view not recorded (2 of 2)]
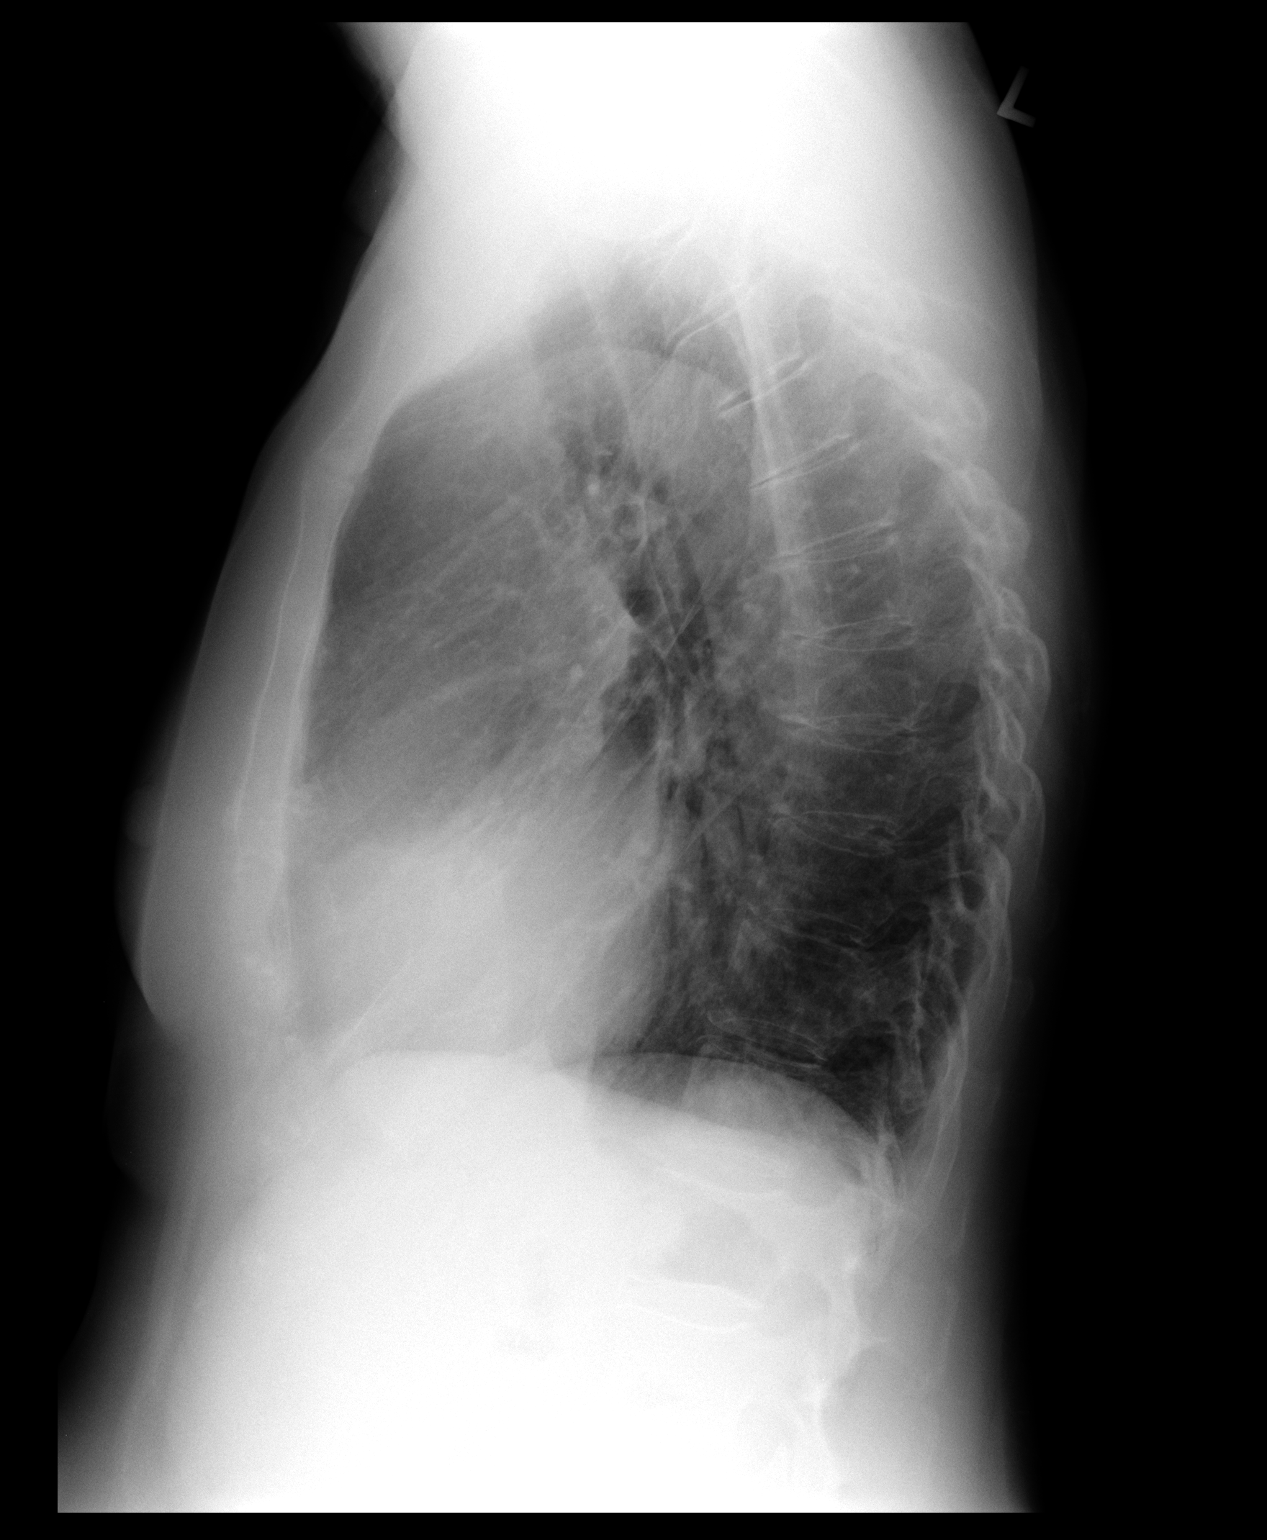

[2 of 2 positions shown; findings below may reference images not displayed]

FINDINGS: Mild hyperinflation. Midline trachea. Mild cardiomegaly.
Atherosclerosis in the transverse aorta. No pleural effusion or
pneumothorax. Biapical pleural thickening. Left greater than right
bibasilar scarring is not significantly changed. No lobar
consolidation.
IMPRESSION: Mild cardiomegaly and bibasilar scarring. No acute superimposed
process.

## 2016-03-11 ENCOUNTER — Other Ambulatory Visit: Payer: Self-pay | Admitting: Family Medicine

## 2016-03-11 DIAGNOSIS — Z1231 Encounter for screening mammogram for malignant neoplasm of breast: Secondary | ICD-10-CM

## 2016-04-14 ENCOUNTER — Ambulatory Visit: Payer: Medicare Other

## 2016-04-17 ENCOUNTER — Ambulatory Visit
Admission: RE | Admit: 2016-04-17 | Discharge: 2016-04-17 | Disposition: A | Discharge status: Home / Self Care | Payer: Medicare Other | Source: Ambulatory Visit | Attending: Family Medicine | Admitting: Family Medicine

## 2016-04-17 ENCOUNTER — Ambulatory Visit: Payer: Medicare Other

## 2016-04-17 DIAGNOSIS — Z1231 Encounter for screening mammogram for malignant neoplasm of breast: Secondary | ICD-10-CM

## 2016-12-04 ENCOUNTER — Ambulatory Visit: Payer: Medicare Other | Admitting: Internal Medicine

## 2017-01-19 ENCOUNTER — Encounter: Payer: Self-pay | Admitting: Internal Medicine

## 2017-01-19 ENCOUNTER — Ambulatory Visit (INDEPENDENT_AMBULATORY_CARE_PROVIDER_SITE_OTHER): Payer: Medicare Other | Admitting: Internal Medicine

## 2017-01-19 DIAGNOSIS — A310 Pulmonary mycobacterial infection: Secondary | ICD-10-CM

## 2017-01-19 DIAGNOSIS — J479 Bronchiectasis, uncomplicated: Secondary | ICD-10-CM

## 2017-01-19 MED ORDER — UMECLIDINIUM-VILANTEROL 62.5-25 MCG/INH IN AEPB: 1.0000 | INHALATION_SPRAY | Freq: Every day | RESPIRATORY_TRACT | 0 refills | Status: DC

## 2017-01-19 NOTE — Progress Notes (Signed)
HPI female never smoker with MAIC, bronchiectasis, hemoptysis, DOE, complicated by HBP She she had an episode of cough with hemoptysis in January of 2011 and was diagnosed with Mycobacterium avium. Her pulmonologist in [place] followed her conservatively. There was another brief episode of self-limited hemoptysis in March of 2012. She has had bronchoscopy x2. CT chest without contrast on 02/15/2011 showed areas of reticular nodularity and scarring in the right middle lobe, PFT 07/09/2011-mild obstructive airways disease with minimal response to bronchodilator. Air-trapping confirming obstruction. Normal diffusion. FEV1 1.84/89%, FEV1/FVC 0.71, FEF 25-75% 1.06/45% after bronchodilator. Office Spirometry 11/22/2015-mild obstructive airways disease. FVC 2.14/76%, FEV1 1.49/70%, FEV1/FVC 0.70, FEF 25-75 percent 0.85/46%. -----------------------------------------------------------------------------------------------------------  11/22/2015-76 year old female never smoker with MAIC, bronchiectasis, hemoptysis, DOE Follows for: Bronchiectasis, MAIC. Pt c/o continued SOB with exertion. Pt denies cough/wheeze/CP/tightness.  She is aware of some dyspnea on exertion climbing a steep inclined to get into church or if climbing multiple stairs. Little wheeze or cough unless she has a cold. Occasional night sweat without fever, adenopathy or rash. CXR 11/22/2014 IMPRESSION: Mild cardiomegaly and bibasilar scarring. No acute superimposed process. Electronically Signed  By: Abigail Miyamoto M.D.  On: 11/22/2014 16:32 Office Spirometry 11/22/2015-mild obstructive airways disease. FVC 2.14/76%, FEV1 1.49/70%, FEV1/FVC 0.70, FEF 25-75 percent 0.85/46%.  01/19/17- 76 year old female never smoker with MAIC, bronchiectasis, hemoptysis, DOE Follows For: myobacterium infection and dyspnea, pt reports she is doing well, denies SOB, only when going up the stairs Stable DOE without acute exacerbation. Did take a Z-Pak for  bronchitis earlier in the spring, Zyrtec for sneezing. Has had no hemoptysis, fever or night sweats since last here. CXR 11/22/15- IMPRESSION: Hyperinflation consistent with emphysema. Stable biapical pleural thickening and scarring in the lingula. No acute cardiopulmonary Abnormality.  ROS-see HPI     += pos Constitutional:   No-   weight loss, +night sweats,  No-fevers, chills, fatigue, lassitude. HEENT:   No-  headaches, difficulty swallowing, tooth/dental problems, sore throat,       No-  sneezing, itching, ear ache, nasal congestion, post nasal drip,  CV:  No-   chest pain, orthopnea, PND, swelling in lower extremities, anasarca, dizziness, palpitations Resp: +shortness of breath with exertion or at rest.              No-   productive cough,  No non-productive cough,  No- coughing up of blood.              No-   change in color of mucus.  No- wheezing.   Skin: No-   rash or lesions. GI:  No-   heartburn, indigestion, abdominal pain, nausea, vomiting,  GU: MS:  No-   joint pain or swelling.  Neuro-     nothing unusual Psych:  No- change in mood or affect. No depression or anxiety.  No memory loss.  OBJ    stable baseline exam General- Alert, Oriented, Affect-appropriate, Distress- none acute; + obese, well-appearing.    + Husband here Skin- rash-none, lesions- none, excoriation- none Lymphadenopathy- none Head- atraumatic            Eyes- Gross vision intact, PERRLA, conjunctivae clear secretions            Ears- Hearing, canals-normal            Nose- Clear, no-Septal dev, mucus, polyps, erosion, perforation             Throat- Mallampati II , mucosa clear , drainage- none, tonsils- atrophic Neck- flexible , trachea midline, no stridor , thyroid  nl, carotid no bruit Chest - symmetrical excursion , unlabored           Heart/CV- RRR , no murmur , no gallop  , no rub, nl s1 s2                           - JVD- none , edema- none, stasis changes- none, varices- none           Lung-  clear to P&A, wheeze- none, cough- none , dullness-none, rub- none           Chest wall-  Abd-  Br/ Gen/ Rectal- Not done, not indicated Extrem- cyanosis- none, clubbing, none, atrophy- none, strength- nl Neuro- grossly intact to observation

## 2017-01-19 NOTE — Patient Instructions (Signed)
Sample Anoro Ellipta      Inhale 1 puff, once daily. Try this every day until the sample runs out. See if you find your breathing is any easier.  Please call as needed

## 2017-01-20 NOTE — Assessment & Plan Note (Addendum)
She has dyspnea on exertion most likely related to overweight and deconditioning. Stable mild cardiomegaly on imaging of uncertain significance but I don't think she has substantial cardiac limitation. Her PCP may eventually want an echocardiogram. She agreed to try an Anoro inhaler sample to see if it has any impact on dyspnea. She is not having cough or wheeze.

## 2017-01-20 NOTE — Assessment & Plan Note (Signed)
Never treated with antibiotics. Now seems clinically inactive without symptoms of progressive disease. CXRs have shown basilar scarring and mild cardiomegaly without change.

## 2017-03-05 IMAGING — DX DG CHEST 2V
2 series · 2 of 2 positions shown · non-contrast
Comparison: PA and lateral chest x-ray November 22, 2014

CLINICAL DATA: History of mycobacterium avium infection, no current
complaints

EXAM:
CHEST  2 VIEW

[chest pa]
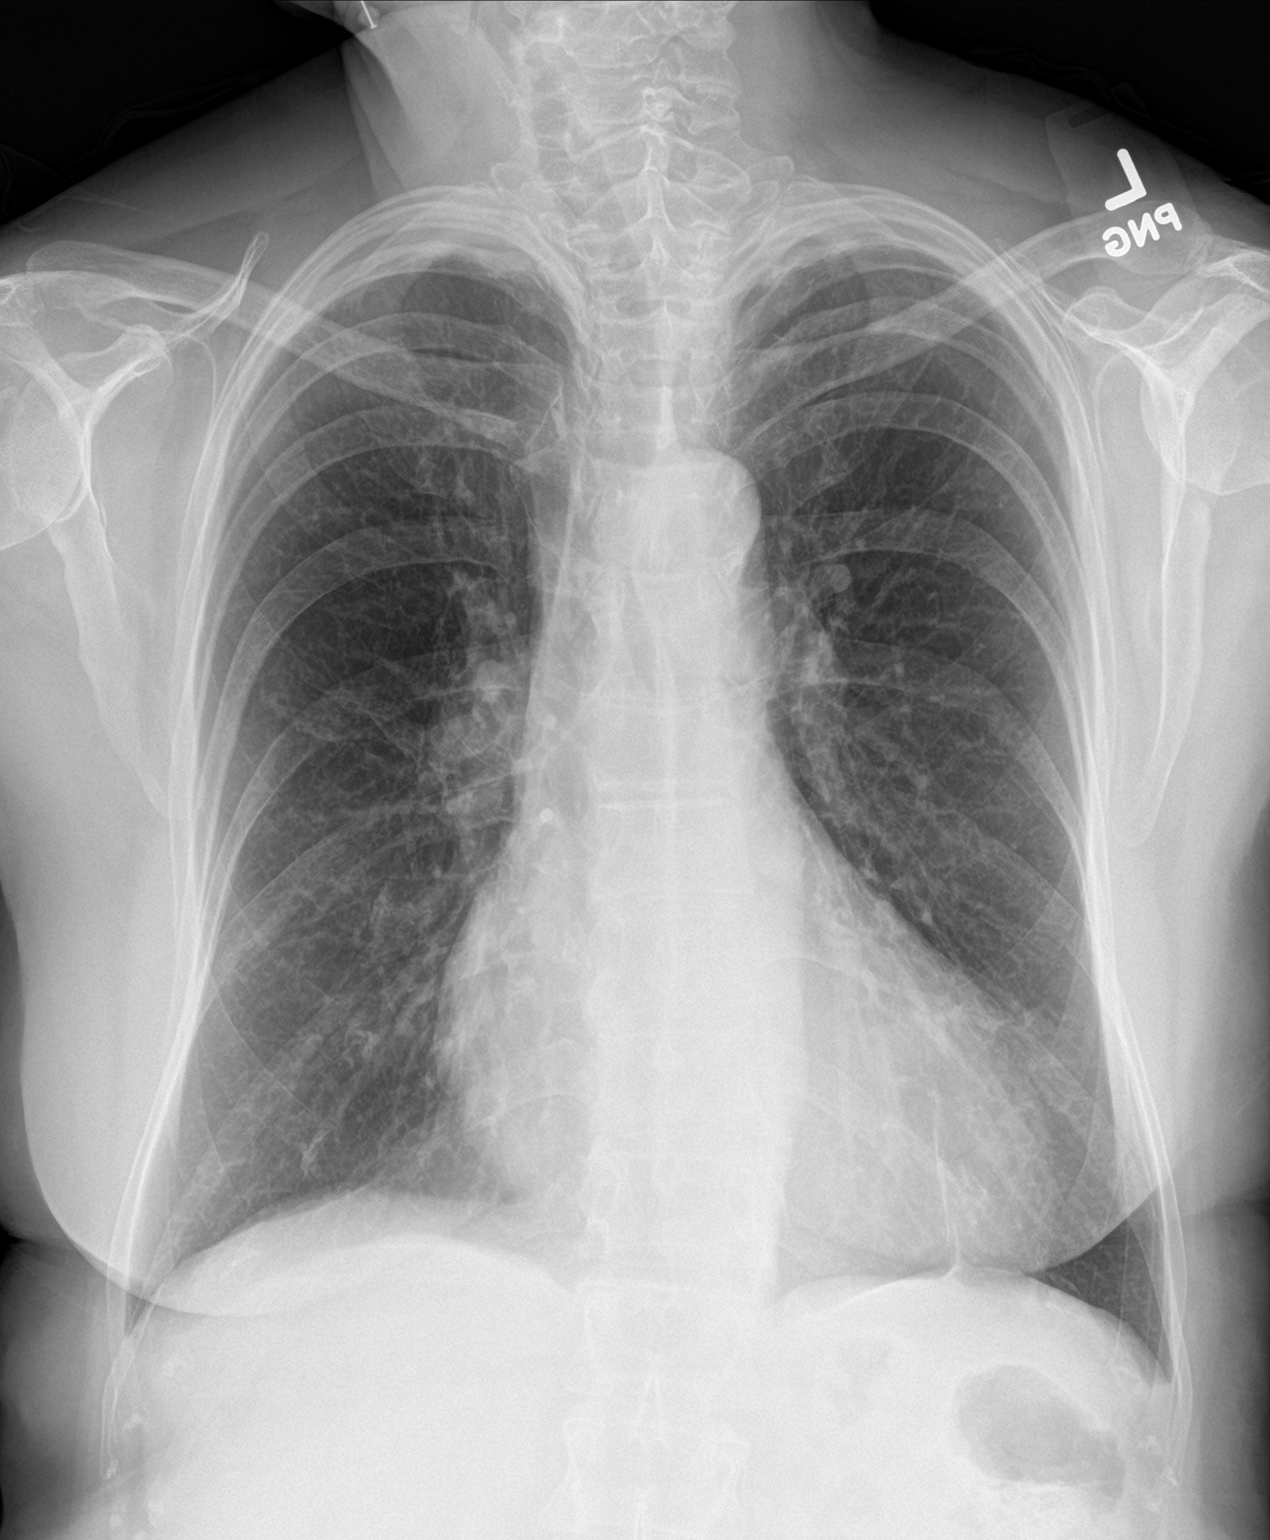

[chest lat]
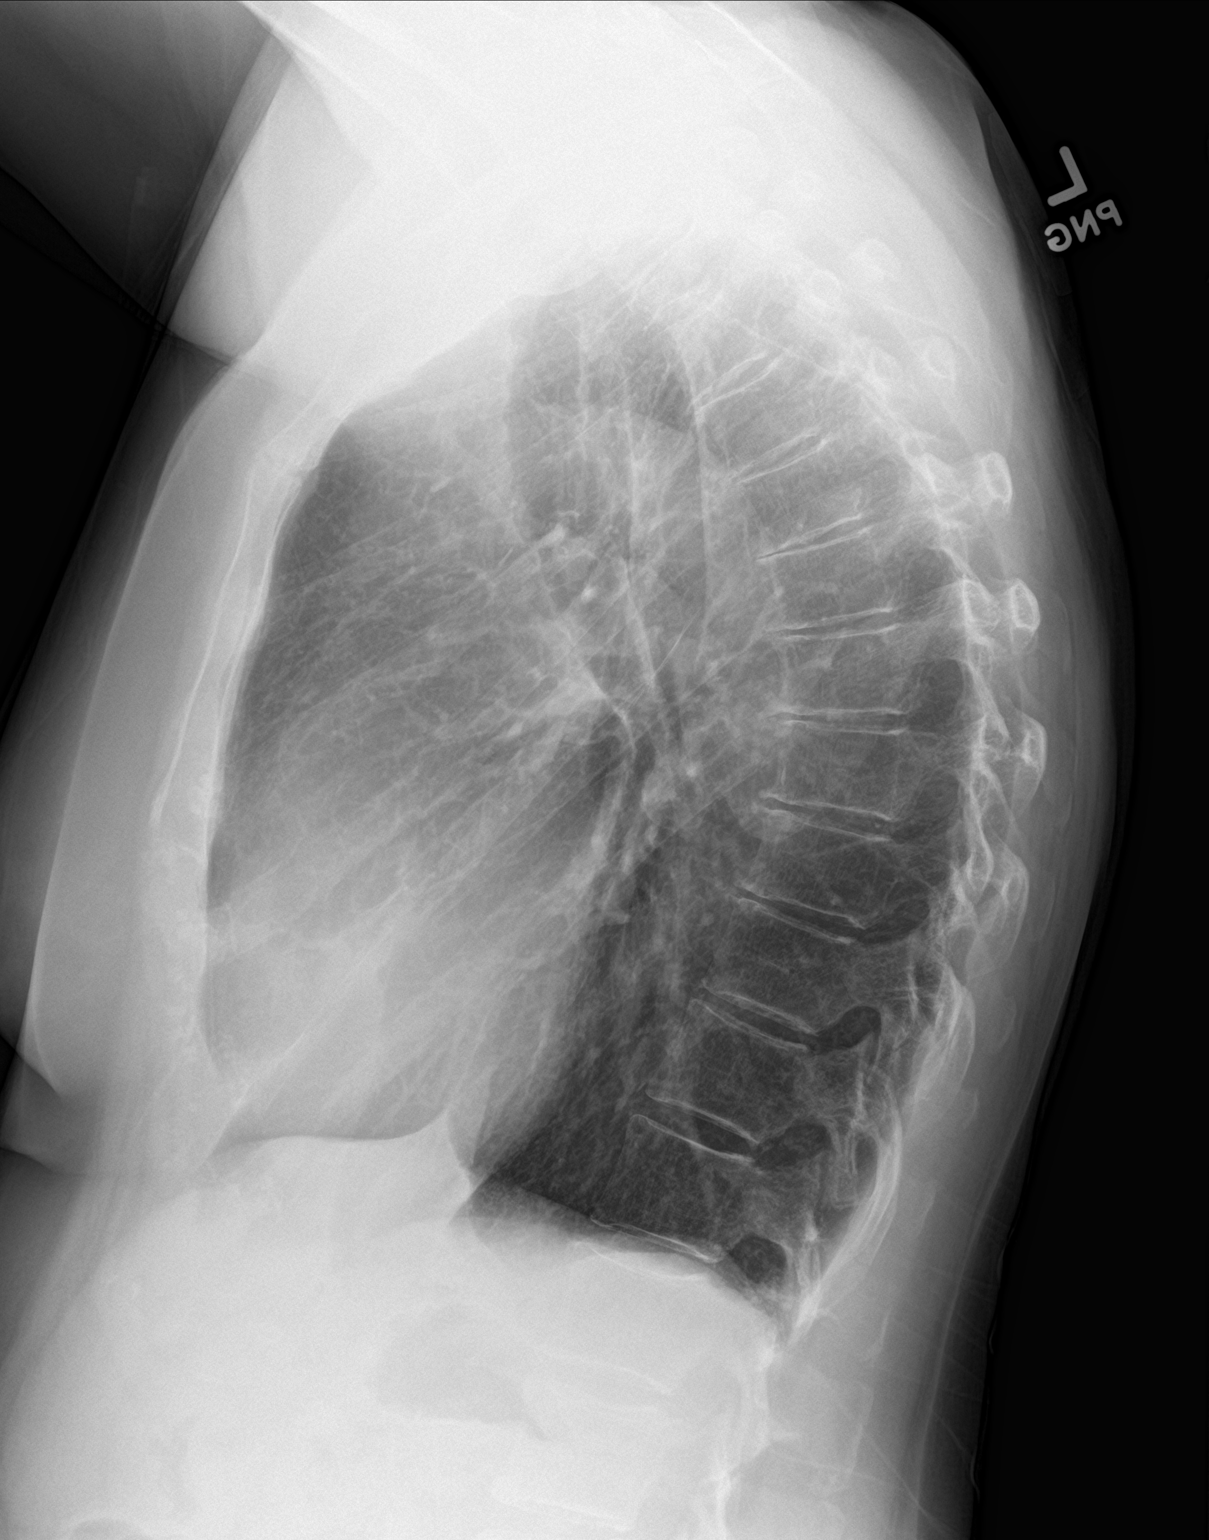

[2 of 2 positions shown; findings below may reference images not displayed]

FINDINGS: The lungs remain hyperinflated. There is no focal infiltrate. There
is minimal stable scarring projecting over the left aspect of the
cardiac silhouette. No pulmonary parenchymal nodules are observed.
There is stable biapical pleural thickening. The heart remains
mildly enlarged. The pulmonary vascularity is normal. The
mediastinum is normal in width. The bony thorax is unremarkable.
IMPRESSION: Hyperinflation consistent with emphysema. Stable biapical pleural
thickening and scarring in the lingula. No acute cardiopulmonary
abnormality.

## 2018-01-19 ENCOUNTER — Ambulatory Visit: Payer: Medicare Other | Admitting: Internal Medicine

## 2018-01-19 ENCOUNTER — Ambulatory Visit (INDEPENDENT_AMBULATORY_CARE_PROVIDER_SITE_OTHER)
Admission: RE | Admit: 2018-01-19 | Discharge: 2018-01-19 | Disposition: A | Discharge status: Home / Self Care | Payer: Medicare Other | Source: Ambulatory Visit | Attending: Internal Medicine | Admitting: Internal Medicine

## 2018-01-19 ENCOUNTER — Encounter: Payer: Self-pay | Admitting: Internal Medicine

## 2018-01-19 VITALS — BP 138/80 | HR 68 | Ht 64.0 in | Wt 180.2 lb

## 2018-01-19 DIAGNOSIS — J479 Bronchiectasis, uncomplicated: Secondary | ICD-10-CM

## 2018-01-19 DIAGNOSIS — A310 Pulmonary mycobacterial infection: Secondary | ICD-10-CM

## 2018-01-19 NOTE — Progress Notes (Signed)
HPI female never smoker with MAIC, bronchiectasis, hemoptysis, DOE, complicated by HBP She she had an episode of cough with hemoptysis in January of 2011 and was diagnosed with Mycobacterium avium. Her pulmonologist in [place] followed her conservatively. There was another brief episode of self-limited hemoptysis in March of 2012. She has had bronchoscopy x2. CT chest without contrast on 02/15/2011 showed areas of reticular nodularity and scarring in the right middle lobe, PFT 07/09/2011-mild obstructive airways disease with minimal response to bronchodilator. Air-trapping confirming obstruction. Normal diffusion. FEV1 1.84/89%, FEV1/FVC 0.71, FEF 25-75% 1.06/45% after bronchodilator. Office Spirometry 11/22/2015-mild obstructive airways disease. FVC 2.14/76%, FEV1 1.49/70%, FEV1/FVC 0.70, FEF 25-75 percent 0.85/46%. ----------------------------------------------------------------------------------------------------------- 01/19/17- 77 year old female never smoker with MAIC, bronchiectasis, hemoptysis, DOE  Follows For: myobacterium infection and dyspnea, pt reports she is doing well, denies SOB, only when going up the stairs Stable DOE without acute exacerbation. Did take a Z-Pak for bronchitis earlier in the spring, Zyrtec for sneezing. Has had no hemoptysis, fever or night sweats since last here. CXR 11/22/15- IMPRESSION: Hyperinflation consistent with emphysema. Stable biapical pleural thickening and scarring in the lingula. No acute cardiopulmonary Abnormality.  01/19/2018- 77 year old female never smoker with MAIC, bronchiectasis, hemoptysis, DOE ----MAIC and Bronchiectasis: Pt states she is doing well for the most part-has to use Flonase occasionally due to pollen.  Has not been using Anoro at all.  Pro-air has worked well when needed-rarely needed.  Does use Zyrtec for postnasal drip.  Denies cough or wheeze.  ROS-see HPI     += positive  Constitutional:   No-   weight loss, +night sweats,   No-fevers, chills, fatigue, lassitude. HEENT:   No-  headaches, difficulty swallowing, tooth/dental problems, sore throat,       No-  sneezing, itching, ear ache, nasal congestion, post nasal drip,  CV:  No-   chest pain, orthopnea, PND, swelling in lower extremities, anasarca, dizziness, palpitations Resp: +shortness of breath with exertion or at rest.              No-   productive cough,  No non-productive cough,  No- coughing up of blood.              No-   change in color of mucus.  No- wheezing.   Skin: No-   rash or lesions. GI:  No-   heartburn, indigestion, abdominal pain, nausea, vomiting,  GU: MS:  No-   joint pain or swelling.  Neuro-     nothing unusual Psych:  No- change in mood or affect. No depression or anxiety.  No memory loss.  OBJ    stable baseline exam General- Alert, Oriented, Affect-appropriate, Distress- none acute; + overweight, well-appearing.     Husband here Skin- rash-none, lesions- none, excoriation- none Lymphadenopathy- none Head- atraumatic            Eyes- Gross vision intact, PERRLA, conjunctivae clear secretions            Ears-+ mild hard of hearing            Nose- Clear, no-Septal dev, mucus, polyps, erosion, perforation             Throat- Mallampati II , mucosa clear , drainage- none, tonsils- atrophic Neck- flexible , trachea midline, no stridor , thyroid nl, carotid no bruit Chest - symmetrical excursion , unlabored           Heart/CV- RRR , no murmur , no gallop  , no rub, nl s1 s2                           -  JVD- none , edema- none, stasis changes- none, varices- none           Lung- clear to P&A, wheeze- none, cough- none , dullness-none, rub- none           Chest wall-  Abd-  Br/ Gen/ Rectal- Not done, not indicated Extrem- cyanosis- none, clubbing, none, atrophy- none, strength- nl, +spider veins Neuro- grossly intact to observation

## 2018-01-19 NOTE — Patient Instructions (Addendum)
Order-  CXR  Dx bronchiectasis without exacerbation  You seem to be doing really well. Please call if we can help

## 2018-01-19 NOTE — Assessment & Plan Note (Signed)
Exam is quiet and symptoms do not suggest active infection or inflammation. Plan-sufficient to keep rescue inhaler on hand without maintenance controller at this time.

## 2018-01-19 NOTE — Assessment & Plan Note (Signed)
Symptoms do not suggest active infection-no fever, cough/phlegm. Plan-update CXR

## 2018-01-20 ENCOUNTER — Telehealth: Payer: Self-pay | Admitting: Internal Medicine

## 2018-01-20 NOTE — Telephone Encounter (Signed)
Called and spoke with pt letting her know the results of her cxr.  Pt espressed understanding. Nothing further needed at this time.

## 2018-08-02 ENCOUNTER — Ambulatory Visit: Payer: Medicare Other | Attending: Family Medicine

## 2018-08-02 ENCOUNTER — Other Ambulatory Visit: Payer: Self-pay

## 2018-08-02 DIAGNOSIS — M6281 Muscle weakness (generalized): Secondary | ICD-10-CM | POA: Diagnosis present

## 2018-08-02 DIAGNOSIS — R2689 Other abnormalities of gait and mobility: Secondary | ICD-10-CM | POA: Insufficient documentation

## 2018-08-02 NOTE — Patient Instructions (Signed)
Access Code: [email or web address]  URL: [email or web address]  Date: 08/02/2018  Prepared by: Sigurd Sos   Exercises  Standing Single Leg Stance with Unilateral Counter Support - 5 reps - 10 hold - 2x daily - 7x weekly  Standing Hip Abduction with Counter Support - 10 reps - 2x daily - 7x weekly  Standing Hip Extension with Counter Support - 10 reps - 2 sets - 2x daily - 7x weekly  Standing Heel Raise with Support - 10 reps - 2 sets - 2x daily - 7x weekly  Seated Long Arc Quad - 10 reps - 2 sets - 5 hold - 2x daily - 7x weekly  Seated Heel Toe Raises - 10 reps - 2 sets - 2x daily - 7x weekly  Seated Heel Raise - 10 reps - 2 sets - 2x daily - 7x weekly

## 2018-08-02 NOTE — Therapy (Signed)
[facility] [address] [place], [place], [address] Phone: [phone]   Fax:  [phone]  Physical Therapy Evaluation  Patient Details  Name: Amanda Sims MRN: [id number] Date of Birth: 1941-04-08 Referring Provider (PT): Marda Stalker, MD   Encounter Date: 08/02/2018  PT End of Session - 08/02/18 1138    Visit Number  1    Date for PT Re-Evaluation  09/13/18    Authorization Type  UHC Medicare    PT Start Time  1101    PT Stop Time  1137    PT Time Calculation (min)  36 min    Activity Tolerance  Patient tolerated treatment well    Behavior During Therapy  [facility] for tasks assessed/performed       Past Medical History:  Diagnosis Date  . Bronchiectasis   . Hypertension   . 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 Osteopenia     Past Surgical History:  Procedure Laterality Date  . dental surgeries  1990's  . VESICOVAGINAL FISTULA CLOSURE W/ TAH  1998    There were no vitals filed for this visit.   Subjective Assessment - 08/02/18 1054    Subjective  Pt presents to PT with report of worsening balance over the past few months.  She describes a few episodes a month where she feels like she is losing her balance and is able to catch herself before she falls.  Pt would like to work on improving her balance.      Pertinent History  none    Limitations  Standing;Walking    How long can you walk comfortably?  in community like to use the cart for comfort    Diagnostic tests  none    Patient Stated Goals  improve balance, continue with Silver Sneakers ([employer])    Currently in Pain?  No/denies         [facility] PT Assessment - 08/02/18 0001      Assessment   Medical Diagnosis  imbalance    Referring Provider (PT)  Marda Stalker, MD    Onset Date/Surgical Date  05/03/18    Next MD Visit  none    Prior Therapy  none      Precautions   Precautions  Fall      Restrictions    Weight Bearing Restrictions  No      Balance Screen   Has the patient fallen in the past 6 months  No    Has the patient had a decrease in activity level because of a fear of falling?   No    Is the patient reluctant to leave their home because of a fear of falling?   No      Home Environment   Living Environment  Private residence    Living Arrangements  Spouse/significant other    Type of [place] to enter    Home Layout  Two level      Prior Function   Level of Independence  Independent    Vocation  Retired    Leisure  Chief of Staff, reading, walk the Community education officer   Overall Cognitive Status  Within Functional Limits for tasks assessed      Observation/Other Assessments   Focus on Therapeutic Outcomes (FOTO)   --   NA     Posture/Postural Control   Posture/Postural Control  Postural limitations  Postural Limitations  Forward head;Flexed trunk      ROM / Strength   AROM / PROM / Strength  Strength      Strength   Overall Strength  Within functional limits for tasks performed    Overall Strength Comments  Bil hips 4/5 in sitting, knees 4+/5, ankle DF 4+/5      Transfers   Transfers  Independent with all Transfers    Five time sit to stand comments   11.47 seconds without hands      Ambulation/Gait   Ambulation/Gait  Yes    Gait Pattern  Step-to pattern;Decreased stride length    Stairs  Yes    Stairs Assistance  6: Modified independent (Device/Increase time)    Stair Management Technique  One rail Right;Alternating pattern    Number of Stairs  4      Balance   Balance Assessed  Yes      Static Standing Balance   Static Standing - Comment/# of Minutes  single leg stance Rt and Lt x 10 seconds each.  Excessive trunk sway without UE support                Objective measurements completed on examination: See above findings.              PT Education - 08/02/18 1135    Education Details   Access Code:  [email or web address]     Person(s) Educated  Patient    Methods  Explanation;Demonstration;Handout    Comprehension  Verbalized understanding;Returned demonstration       PT Short Term Goals - 08/02/18 1141      PT SHORT TERM GOAL #1   Title  be independent in intial HEP    Time  3    Period  Weeks    Status  New    Target Date  08/23/18      PT SHORT TERM GOAL #2   Title  report 30% increased confidence with gait in the community and on unlevel surfaces    Time  3    Period  Weeks    Status  New    Target Date  08/23/18      PT SHORT TERM GOAL #3   Title  perform 5x sit to stand in < or = to 11 seconds to improve balance    Time  3    Period  Weeks    Status  New    Target Date  08/23/18        PT Long Term Goals - 08/02/18 1142      PT LONG TERM GOAL #1   Title  be independent in advanced HEP    Time  6    Period  Weeks    Target Date  09/13/18      PT LONG TERM GOAL #2   Title  perform single leg stance on Rt and Lt without UE support x 10 seconds without excessive trunk sway    Time  6    Period  Weeks    Status  New    Target Date  09/13/18      PT LONG TERM GOAL #3   Title  report 60% improved confidence with gait on unlevel surface and in the community    Time  6    Period  Weeks    Status  New    Target Date  09/13/18      PT LONG TERM GOAL #4   Title  perform 5x sit to stand in < or = to 10 seconds to improve balance    Time  6    Period  Weeks    Status  New    Target Date  09/13/18             Plan - 08/02/18 1146    Clinical Impression Statement  Pt presents to PT with report of declining balance over the past 3 months.  Pt has not had any falls just loss of balance without fall.  Pt is active with [employer]ne[employer] classes 2x/wk and walks her dog daily.  Pt demonstrates mild imbalance with change of directions with gait on level surface and trunk instability with single leg stance on level surface x 10 seconds.  Pt demonstrates weakness in bil  hips and shows hip instability with standing activity including negotiating steps.  Pt will benefit from skilled PT to work on balance training to improve response to loss of balance and improve safety at home and in the community.      History and Personal Factors relevant to plan of care:  none    Clinical Presentation  Stable    Clinical Presentation due to:  3 month history of mild imbalance    Clinical Decision Making  Low    Rehab Potential  Excellent    PT Frequency  1x / week    PT Duration  6 weeks    PT Treatment/Interventions  ADLs/Self Care Home Management;Gait training;Stair training;Functional mobility training;Therapeutic activities;Therapeutic exercise;Balance training;Patient/family education;Neuromuscular re-education;Manual techniques    PT Next Visit Plan  Balance training, focus on hip stability and proprioception, build HEP as pt is coming 1x/wk    PT Home Exercise Plan  Access Code: [email or web address]    Consulted and Agree with Plan of Care  Patient       Patient will benefit from skilled therapeutic intervention in order to improve the following deficits and impairments:  Abnormal gait, Decreased mobility, Decreased strength, Decreased balance, Decreased activity tolerance  Visit Diagnosis: Muscle weakness (generalized) - Plan: PT plan of care cert/re-cert  Other abnormalities of gait and mobility - Plan: PT plan of care cert/re-cert     Problem List Patient Active Problem List   Diagnosis Date Noted  . Obesity 11/22/2015  . 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 Bronchiectasis without acute exacerbation ([place]) 06/14/2011   Sigurd Sos, PT 08/02/18 11:50 AM  [facility] [address] [place], [place], [address] Phone: [phone]   Fax:  [phone]  Name: Amanda Sims MRN: [id number] Date of Birth: 02/07/1941

## 2018-08-09 ENCOUNTER — Ambulatory Visit: Payer: Medicare Other

## 2018-08-09 DIAGNOSIS — R2689 Other abnormalities of gait and mobility: Secondary | ICD-10-CM

## 2018-08-09 DIAGNOSIS — M6281 Muscle weakness (generalized): Secondary | ICD-10-CM | POA: Diagnosis not present

## 2018-08-09 NOTE — Therapy (Signed)
[facility] [address] [place], [place], [address] Phone: [phone]   Fax:  [phone]  Physical Therapy Treatment  Patient Details  Name: Amanda Sims MRN: [id number] Date of Birth: 1940-12-02 Referring Provider (PT): Marda Stalker, MD   Encounter Date: 08/09/2018  PT End of Session - 08/09/18 0930    Visit Number  2    Date for PT Re-Evaluation  09/13/18    Authorization Type  UHC Medicare    PT Start Time  0845    PT Stop Time  0931    PT Time Calculation (min)  46 min    Activity Tolerance  Patient tolerated treatment well    Behavior During Therapy  [facility] for tasks assessed/performed       Past Medical History:  Diagnosis Date  . Bronchiectasis   . Hypertension   . 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 Osteopenia     Past Surgical History:  Procedure Laterality Date  . dental surgeries  1990's  . VESICOVAGINAL FISTULA CLOSURE W/ TAH  1998    There were no vitals filed for this visit.  Subjective Assessment - 08/09/18 0850    Subjective  I haven't been doing my exercises.      Patient Stated Goals  improve balance, continue with Silver Sneakers ([employer])    Currently in Pain?  No/denies                       [facility] Adult PT Treatment/Exercise - 08/09/18 0001      Exercises   Exercises  Knee/Hip;Ankle;Lumbar      Lumbar Exercises: Stretches   Active Hamstring Stretch  Left;Right;3 reps;20 seconds      Lumbar Exercises: Seated   Sit to Stand  20 reps    Sit to Stand Limitations  no hands      Knee/Hip Exercises: Aerobic   Nustep  NuStep: Level 2 x 8 minutes      Knee/Hip Exercises: Standing   Heel Raises  Both;2 sets;10 reps    Hip Abduction  Stengthening;Both;2 sets;10 reps    Hip Extension  Stengthening;Both;2 sets;10 reps    Rocker Board  3 minutes    SLS  5x 10 seconds    Rebounder  rebounder 3 way weight shifting       Knee/Hip  Exercises: Seated   Long Arc Quad  Strengthening;Both;10 reps    Marching Limitations  2x10               PT Short Term Goals - 08/02/18 1141      PT SHORT TERM GOAL #1   Title  be independent in intial HEP    Time  3    Period  Weeks    Status  New    Target Date  08/23/18      PT SHORT TERM GOAL #2   Title  report 30% increased confidence with gait in the community and on unlevel surfaces    Time  3    Period  Weeks    Status  New    Target Date  08/23/18      PT SHORT TERM GOAL #3   Title  perform 5x sit to stand in < or = to 11 seconds to improve balance    Time  3    Period  Weeks    Status  New    Target Date  08/23/18  PT Long Term Goals - 08/02/18 1142      PT LONG TERM GOAL #1   Title  be independent in advanced HEP    Time  6    Period  Weeks    Target Date  09/13/18      PT LONG TERM GOAL #2   Title  perform single leg stance on Rt and Lt without UE support x 10 seconds without excessive trunk sway    Time  6    Period  Weeks    Status  New    Target Date  09/13/18      PT LONG TERM GOAL #3   Title  report 60% improved confidence with gait on unlevel surface and in the community    Time  6    Period  Weeks    Status  New    Target Date  09/13/18      PT LONG TERM GOAL #4   Title  perform 5x sit to stand in < or = to 10 seconds to improve balance    Time  6    Period  Weeks    Status  New    Target Date  09/13/18            Plan - 08/09/18 0901    Clinical Impression Statement  Pt reports non compliance with HEP. PT reviewed all HEP and educated pt regarding the importance of compliance to improve endurance and balance.  Pt performed all exercises with good form and required stand by assistance for safety and instruction in correct technique. Single leg stance is more stable on Lt vs Rt today.   Pt will continue to benefit from skilled PT to address chronic balance deficits.      Rehab Potential  Excellent    PT Frequency   1x / week    PT Duration  6 weeks    PT Treatment/Interventions  ADLs/Self Care Home Management;Gait training;Stair training;Functional mobility training;Therapeutic activities;Therapeutic exercise;Balance training;Patient/family education;Neuromuscular re-education;Manual techniques    PT Next Visit Plan  add to HEP when pt demonstrates improved compliance, balance, proprioception, endurance    PT Home Exercise Plan  Access Code: [email or web address]    Consulted and Agree with Plan of Care  Patient       Patient will benefit from skilled therapeutic intervention in order to improve the following deficits and impairments:  Abnormal gait, Decreased mobility, Decreased strength, Decreased balance, Decreased activity tolerance  Visit Diagnosis: Muscle weakness (generalized)  Other abnormalities of gait and mobility     Problem List Patient Active Problem List   Diagnosis Date Noted  . Obesity 11/22/2015  . 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 Bronchiectasis without acute exacerbation ([place]) 06/14/2011    Sigurd Sos, PT 08/09/18 9:32 AM  [facility] [address], [place], [place], [address] Phone: [phone]   Fax:  [phone]  Name: BREHANNA DEVENY MRN: [id number] Date of Birth: 1940/12/16

## 2018-08-19 ENCOUNTER — Ambulatory Visit: Payer: Medicare Other | Admitting: Physical Therapy

## 2018-08-19 ENCOUNTER — Encounter: Payer: Self-pay | Admitting: Physical Therapy

## 2018-08-19 DIAGNOSIS — M6281 Muscle weakness (generalized): Secondary | ICD-10-CM

## 2018-08-19 DIAGNOSIS — R2689 Other abnormalities of gait and mobility: Secondary | ICD-10-CM

## 2018-08-19 NOTE — Therapy (Signed)
[facility] [address] [place], [place], [address] Phone: [phone]   Fax:  [phone]  Physical Therapy Treatment  Patient Details  Name: Amanda Sims MRN: [id number] Date of Birth: [birth year] Referring Provider (PT): Marda Stalker, MD   Encounter Date: 08/19/2018  PT End of Session - 08/19/18 0906    Visit Number  3    Date for PT Re-Evaluation  09/13/18    Authorization Type  UHC Medicare    PT Start Time  0845    PT Stop Time  0925    PT Time Calculation (min)  40 min    Activity Tolerance  Patient tolerated treatment well    Behavior During Therapy  [facility] for tasks assessed/performed       Past Medical History:  Diagnosis Date  . Bronchiectasis   . Hypertension   . 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 Osteopenia     Past Surgical History:  Procedure Laterality Date  . dental surgeries  1990's  . VESICOVAGINAL FISTULA CLOSURE W/ TAH  1998    There were no vitals filed for this visit.  Subjective Assessment - 08/19/18 0847    Subjective  I haven't been doing my exercises.  The holidays have kept me busy. I went  [employer] last Friday. I am having trouble committing to my exercise program.     Pertinent History  none    Limitations  Standing;Walking    How long can you walk comfortably?  in community like to use the cart for comfort    Diagnostic tests  none    Patient Stated Goals  improve balance, continue with Silver Sneakers ([employer])    Currently in Pain?  No/denies                       [facility] Adult PT Treatment/Exercise - 08/19/18 0001      Self-Care   Self-Care  Other Self-Care Comments    Other Self-Care Comments   Discussed on an exercise program that she can commit to . going to the [employer]	[place] program on Tuesday and Friday at a class she enjoys      Lumbar Exercises: Seated   Sit to Stand  20 reps    Sit to Stand Limitations   no hands      Knee/Hip Exercises: Aerobic   Nustep  NuStep: Level 2 x 8 minutes; Seat #8 and arm #9   while assessing patient     Knee/Hip Exercises: Standing   Knee Flexion  Strengthening;Right;Left;2 sets;10 reps    Knee Flexion Limitations  2# weight on ankles    Hip Abduction  Stengthening;Right;Left;2 sets;10 reps;Knee straight    Abduction Limitations  2# on each ankle    Hip Extension  Stengthening;Right;Left;2 sets;10 reps;Knee straight    Extension Limitations  2# ankle    Forward Step Up  1 set;Right;Left;10 reps;Hand Hold: 2    Functional Squat  1 set;5 reps;5 seconds   holding onto treadmill            PT Education - 08/19/18 0905    Education Details  how to incorporate a schedule of going to [employer] and making it a set schedule; Access Code: [email or web address]     Person(s) Educated  Patient    Methods  Explanation    Comprehension  Verbalized understanding       PT Short Term Goals - 08/19/18 0930  PT SHORT TERM GOAL #1   Title  be independent in intial HEP    Time  3    Period  Weeks    Status  On-going      PT SHORT TERM GOAL #2   Title  report 30% increased confidence with gait in the community and on unlevel surfaces    Time  3    Period  Weeks    Status  On-going      PT SHORT TERM GOAL #3   Title  perform 5x sit to stand in < or = to 11 seconds to improve balance    Time  3    Period  Weeks    Status  On-going        PT Long Term Goals - 08/02/18 1142      PT LONG TERM GOAL #1   Title  be independent in advanced HEP    Time  6    Period  Weeks    Target Date  09/13/18      PT LONG TERM GOAL #2   Title  perform single leg stance on Rt and Lt without UE support x 10 seconds without excessive trunk sway    Time  6    Period  Weeks    Status  New    Target Date  09/13/18      PT LONG TERM GOAL #3   Title  report 60% improved confidence with gait on unlevel surface and in the community    Time  6    Period  Weeks    Status   New    Target Date  09/13/18      PT LONG TERM GOAL #4   Title  perform 5x sit to stand in < or = to 10 seconds to improve balance    Time  6    Period  Weeks    Status  New    Target Date  09/13/18            Plan - 08/19/18 7510    Clinical Impression Statement  Patient reports she is still not consistent with her HEP. Patient and therapist discussed a plan to go to [employer] sneakers as a scheduled time and day. Patient able to perform her exercises without fatique. Patient will benefit from skilled therapy to address chronic balance deficits.     Rehab Potential  Excellent    PT Frequency  1x / week    PT Duration  6 weeks    PT Treatment/Interventions  ADLs/Self Care Home Management;Gait training;Stair training;Functional mobility training;Therapeutic activities;Therapeutic exercise;Balance training;Patient/family education;Neuromuscular re-education;Manual techniques    PT Next Visit Plan  add to HEP when pt demonstrates improved compliance, balance, proprioception, endurance; emphasis [employer]    PT Home Exercise Plan  Access Code: [email or web address]    Recommended Other Services  MD signed initial evaluation    Consulted and Agree with Plan of Care  Patient       Patient will benefit from skilled therapeutic intervention in order to improve the following deficits and impairments:  Abnormal gait, Decreased mobility, Decreased strength, Decreased balance, Decreased activity tolerance  Visit Diagnosis: Muscle weakness (generalized)  Other abnormalities of gait and mobility     Problem List Patient Active Problem List   Diagnosis Date Noted  . Obesity 11/22/2015  . 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 Bronchiectasis without acute exacerbation ([place]) 06/14/2011    Earlie Counts, PT 08/19/18 9:31 AM  [facility] [address] [place], [place], [address] Phone: [phone]   Fax:   [phone]  Name: TONYA WANTZ MRN: [id number] Date of Birth: [birth year]

## 2018-08-19 NOTE — Patient Instructions (Signed)
Access Code: [email or web address]  URL: [email or web address]  Date: 08/19/2018  Prepared by: Earlie Counts   Exercises  Standing Single Leg Stance with Unilateral Counter Support - 5 reps - 10 hold - 2x daily - 7x weekly  Standing Hip Abduction with Counter Support - 10 reps - 2x daily - 7x weekly  Standing Hip Extension with Counter Support - 10 reps - 2 sets - 2x daily - 7x weekly  Standing Heel Raise with Support - 10 reps - 2 sets - 2x daily - 7x weekly  Seated Long Arc Quad - 10 reps - 2 sets - 5 hold - 2x daily - 7x weekly  Seated Heel Toe Raises - 10 reps - 2 sets - 2x daily - 7x weekly  Seated Heel Raise - 10 reps - 2 sets - 2x daily - 7x weekly  Sit to Stand without Arm Support - 10 reps - 1 sets - 1x daily - 7x weekly  Mini Squat with Counter Support - 5 reps - 1 sets - 5 sec hold - 1x daily - 7x weekly  [facility] [address], [place], [place] [address] Phone # [phone] Fax [phone]

## 2018-08-25 ENCOUNTER — Encounter: Payer: Self-pay | Admitting: Physical Therapy

## 2018-08-25 ENCOUNTER — Ambulatory Visit: Payer: Medicare Other | Attending: Family Medicine | Admitting: Physical Therapy

## 2018-08-25 DIAGNOSIS — R2689 Other abnormalities of gait and mobility: Secondary | ICD-10-CM | POA: Diagnosis present

## 2018-08-25 DIAGNOSIS — M6281 Muscle weakness (generalized): Secondary | ICD-10-CM | POA: Insufficient documentation

## 2018-08-25 NOTE — Patient Instructions (Signed)
Access Code: [email or web address]  URL: [email or web address]  Date: 08/25/2018  Prepared by: Earlie Counts   Exercises  Standing Single Leg Stance with Unilateral Counter Support - 5 reps - 10 hold - 2x daily - 7x weekly  Standing Hip Abduction with Counter Support - 10 reps - 2x daily - 7x weekly  Standing Hip Extension with Counter Support - 10 reps - 2 sets - 2x daily - 7x weekly  Standing Heel Raise with Support - 10 reps - 2 sets - 2x daily - 7x weekly  Seated Long Arc Quad - 10 reps - 2 sets - 5 hold - 2x daily - 7x weekly  Seated Heel Toe Raises - 10 reps - 2 sets - 2x daily - 7x weekly  Seated Heel Raise - 10 reps - 2 sets - 2x daily - 7x weekly  Sit to Stand without Arm Support - 10 reps - 1 sets - 1x daily - 7x weekly  Mini Squat with Counter Support - 5 reps - 1 sets - 5 sec hold - 1x daily - 7x weekly  Mini Squat with Counter Support - 10 reps - 1 sets - 5 sec hold - 1x daily - 7x weekly  Heel Walking with Counter Support - 10 reps - 1 sets - 1x daily - 7x weekly  Single Leg Heel Raise with Chair Support - 20 reps - 1 sets - 1x daily - 7x weekly  Gastroc Stretch on Wall - 2 reps - 1 sets - 30 sec hold - 1x daily - 7x weekly  Seated Knee Extension with Resistance - 10 reps - 2 sets - 1 sec hold - 1x daily - 7x weekly  Standing Hamstring Curl with Resistance - 10 reps - 2 sets - 1 sec hold - 1x daily - 7x weekly  Patient Education  What You Can Do to Prevent Falls  How to Fall Safely  How to Prevent Falls  Check for Safety  How to Get Up After a Fall [facility] [address], [place], [place] [address] Phone # [phone] Fax [phone]

## 2018-08-25 NOTE — Therapy (Signed)
[facility] [address] [place], [place], [address] Phone: [phone]   Fax:  [phone]  Physical Therapy Treatment  Patient Details  Name: Amanda Sims MRN: [id number] Date of Birth: [birth year] Referring Provider (PT): Marda Stalker, MD   Encounter Date: 08/25/2018  PT End of Session - 08/25/18 1151    Visit Number  4    Date for PT Re-Evaluation  09/13/18    Authorization Type  [employer] Medicare    PT Start Time  1100    PT Stop Time  1140    PT Time Calculation (min)  40 min    Activity Tolerance  Patient tolerated treatment well    Behavior During Therapy  [facility] for tasks assessed/performed       Past Medical History:  Diagnosis Date  . Bronchiectasis   . Hypertension   . 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 Osteopenia     Past Surgical History:  Procedure Laterality Date  . dental surgeries  1990's  . VESICOVAGINAL FISTULA CLOSURE W/ TAH  1998    There were no vitals filed for this visit.  Subjective Assessment - 08/25/18 1105    Subjective  I have made some progress. I did silver sneakers Monday morning.     Limitations  Standing;Walking    How long can you walk comfortably?  in community like to use the cart for comfort    Diagnostic tests  none    Patient Stated Goals  improve balance, continue with Silver Sneakers ([employer])    Currently in Pain?  No/denies         [facility] PT Assessment - 08/25/18 0001      Assessment   Medical Diagnosis  imbalance    Referring Provider (PT)  Marda Stalker, MD    Onset Date/Surgical Date  05/03/18    Next MD Visit  none    Prior Therapy  none      Precautions   Precautions  Fall      Restrictions   Weight Bearing Restrictions  No      [place] residence    Living Arrangements  Spouse/significant other    Type of [place] to enter    Home Layout  Two  level      Prior Function   Level of Independence  Independent    Vocation  Retired    Leisure  Chief of Staff, reading, walk the Community education officer   Overall Cognitive Status  Within Functional Limits for tasks assessed      Observation/Other Assessments   Focus on Therapeutic Outcomes (FOTO)   no value      Posture/Postural Control   Posture/Postural Control  Postural limitations    Postural Limitations  Forward head      ROM / Strength   AROM / PROM / Strength  Strength      Strength   Overall Strength  Within functional limits for tasks performed    Overall Strength Comments  Bil hips 4+/5 in sitting, knees 4+/5, ankle DF 4+/5      Transfers   Transfers  Independent with all Transfers    Five time sit to stand comments   11.47 seconds without hands      Ambulation/Gait   Ambulation/Gait  No  [place] [facility] PT Treatment/Exercise - 08/25/18 0001      Self-Care   Self-Care  Other Self-Care Comments    Other Self-Care Comments   talked about balance and gave patient educational handouts on balance      Therapeutic Activites    Therapeutic Activities  Other Therapeutic Activities    Other Therapeutic Activities  getting up and down from floor using a chair, tried without a chair and unable to perform      Lumbar Exercises: Stretches   Gastroc Stretch  Right;Left;2 reps;30 seconds      Lumbar Exercises: Standing   Heel Raises  20 reps   right , left, holding on   Functional Squats  10 reps;5 seconds    Other Standing Lumbar Exercises  knee flexion with red band 2x10      Knee/Hip Exercises: Standing   Heel Raises  Right;Left;1 set;20 reps;1 second    Other Standing Knee Exercises  heel walk 8 feet times 2      Knee/Hip Exercises: Seated   Long Arc Quad  Strengthening;Right;Left;1 set;10 reps   red band            PT Education - 08/25/18 1136    Education Details  Access Code: [email or web address]    Person(s) Educated  Patient     Methods  Explanation;Demonstration;Verbal cues;Handout    Comprehension  Returned demonstration;Verbalized understanding       PT Short Term Goals - 08/25/18 1152      PT SHORT TERM GOAL #1   Title  be independent in intial HEP    Time  3    Period  Weeks    Status  Achieved      PT SHORT TERM GOAL #2   Title  report 30% increased confidence with gait in the community and on unlevel surfaces    Time  3    Period  Weeks    Status  Achieved      PT SHORT TERM GOAL #3   Title  perform 5x sit to stand in < or = to 11 seconds to improve balance    Time  3    Period  Weeks    Status  Achieved        PT Long Term Goals - 08/25/18 1152      PT LONG TERM GOAL #1   Title  be independent in advanced HEP    Time  6    Period  Weeks    Status  Achieved      PT LONG TERM GOAL #2   Title  perform single leg stance on Rt and Lt without UE support x 10 seconds without excessive trunk sway    Time  6    Period  Weeks    Status  Not Met      PT LONG TERM GOAL #3   Title  report 60% improved confidence with gait on unlevel surface and in the community    Time  6    Period  Weeks    Status  Achieved      PT LONG TERM GOAL #4   Title  perform 5x sit to stand in < or = to 10 seconds to improve balance    Time  6    Period  Weeks    Status  Not Met            Plan - 08/25/18 1111    Clinical Impression Statement  Patient reports she  has not lost her confidence that she will not fall due to exercising. Patient is not going to silver sneakers and performing her HEP. PT set up the medbridge app on her phone so she can do her exercises. Patient is independent with her HEP. Patient has purchased new sneakers to do her exercises. Patient wants to be discharged and perform her program at home.     Rehab Potential  Excellent    PT Frequency  --    PT Duration  --    PT Treatment/Interventions  ADLs/Self Care Home Management;Gait training;Stair training;Functional mobility  training;Therapeutic activities;Therapeutic exercise;Balance training;Patient/family education;Neuromuscular re-education;Manual techniques    PT Next Visit Plan  discharge to HEP this visit.     PT Home Exercise Plan  Access Code: [email or web address]    Consulted and Agree with Plan of Care  Patient       Patient will benefit from skilled therapeutic intervention in order to improve the following deficits and impairments:  Abnormal gait, Decreased mobility, Decreased strength, Decreased balance, Decreased activity tolerance  Visit Diagnosis: Muscle weakness (generalized)  Other abnormalities of gait and mobility     Problem List Patient Active Problem List   Diagnosis Date Noted  . Obesity 11/22/2015  . 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 Bronchiectasis without acute exacerbation ([place]) 06/14/2011    Earlie Counts, PT 08/25/18 11:57 AM   [facility] [address] [place], [place], [address] Phone: [phone]   Fax:  [phone]  Name: Amanda Sims MRN: [id number] Date of Birth: 11/02/1940  PHYSICAL THERAPY DISCHARGE SUMMARY  Visits from Start of Care: 4  Current functional level related to goals / functional outcomes: See above.    Remaining deficits: See above.   Education / Equipment: HEP Plan: Patient agrees to discharge.  Patient goals were partially met. Patient is being discharged due to being pleased with the current functional level. Thank you for the referral. Earlie Counts, PT 08/25/18 11:57 AM   ?????

## 2019-01-18 ENCOUNTER — Ambulatory Visit: Payer: Medicare Other | Admitting: Internal Medicine

## 2019-04-24 ENCOUNTER — Ambulatory Visit: Payer: Medicare Other | Admitting: Internal Medicine

## 2019-06-02 ENCOUNTER — Ambulatory Visit: Payer: Medicare Other | Admitting: Internal Medicine

## 2019-06-02 ENCOUNTER — Other Ambulatory Visit: Payer: Self-pay

## 2019-06-02 ENCOUNTER — Encounter: Payer: Self-pay | Admitting: Internal Medicine

## 2019-06-02 ENCOUNTER — Ambulatory Visit (INDEPENDENT_AMBULATORY_CARE_PROVIDER_SITE_OTHER): Payer: Medicare Other

## 2019-06-02 VITALS — BP 148/98 | HR 58 | Temp 97.7°F | Ht 64.0 in | Wt 170.0 lb

## 2019-06-02 DIAGNOSIS — J479 Bronchiectasis, uncomplicated: Secondary | ICD-10-CM

## 2019-06-02 NOTE — Assessment & Plan Note (Signed)
Overweight, may contribute to dyspnea with exertion. Care with diet and more walking advised.

## 2019-06-02 NOTE — Patient Instructions (Signed)
Order- CXR-   Dx Bronchiectasis  Don't forget that flu vaccine this fall !  Please call if we can help

## 2019-06-02 NOTE — Assessment & Plan Note (Signed)
Maintaining good control without purulent exacerbation. Doesn't report symptoms suggesting progression of MAIC, but we will update CXR.

## 2019-06-02 NOTE — Progress Notes (Signed)
HPI female never smoker with MAIC, bronchiectasis, hemoptysis, DOE, complicated by HBP She she had an episode of cough with hemoptysis in January of 2011 and was diagnosed with Mycobacterium avium. Her pulmonologist in [place] followed her conservatively. There was another brief episode of self-limited hemoptysis in March of 2012. She has had bronchoscopy x2. CT chest without contrast on 02/15/2011 showed areas of reticular nodularity and scarring in the right middle lobe, PFT 07/09/2011-mild obstructive airways disease with minimal response to bronchodilator. Air-trapping confirming obstruction. Normal diffusion. FEV1 1.84/89%, FEV1/FVC 0.71, FEF 25-75% 1.06/45% after bronchodilator. Office Spirometry 11/22/2015-mild obstructive airways disease. FVC 2.14/76%, FEV1 1.49/70%, FEV1/FVC 0.70, FEF 25-75 percent 0.85/46%. -----------------------------------------------------------------------------------------------------------  01/19/2018- 78 year old female never smoker with MAIC, bronchiectasis, hemoptysis, DOE ----[facility] and Bronchiectasis: Pt states she is doing well for the most part-has to use Flonase occasionally due to pollen.  Has not been using Anoro at all.  Pro-air has worked well when needed-rarely needed.  Does use Zyrtec for postnasal drip.  Denies cough or wheeze.  06/02/2019- 78 year old female never smoker with MAIC, bronchiectasis, hemoptysis, DOE -----pt states breathing is generally at baseline; reports mucus upon waking, which she takes Zyrtec for Occasional mild morning cough, only scant clear mucus, no fever or sweats. Note Lisinopril. Will get flu vax at PCP office.  Some seasonal drainage, little sneeze.  Covid discussion. CXR 01/19/18- COPD. Evidence of previous granulomatous infection. No acute pneumonia nor CHF. Thoracic aortic atherosclerosis.  ROS-see HPI     += positive  Constitutional:   No-   weight loss, +night sweats,  No-fevers, chills, fatigue, lassitude. HEENT:    No-  headaches, difficulty swallowing, tooth/dental problems, sore throat,       No-  sneezing, itching, ear ache, nasal congestion, post nasal drip,  CV:  No-   chest pain, orthopnea, PND, swelling in lower extremities, anasarca, dizziness, palpitations Resp: +shortness of breath with exertion or at rest.              No-   productive cough,  No non-productive cough,  No- coughing up of blood.              No-   change in color of mucus.  No- wheezing.   Skin: No-   rash or lesions. GI:  No-   heartburn, indigestion, abdominal pain, nausea, vomiting,  GU: MS:  No-   joint pain or swelling.  Neuro-     nothing unusual Psych:  No- change in mood or affect. No depression or anxiety.  No memory loss.  OBJ    stable baseline exam General- Alert, Oriented, Affect-appropriate, Distress- none acute; + overweight, well-appearing.     Husband here Skin- rash-none, lesions- none, excoriation- none Lymphadenopathy- none Head- atraumatic            Eyes- Gross vision intact, PERRLA, conjunctivae clear secretions            Ears-+ mild hard of hearing            Nose- Clear, no-Septal dev, mucus, polyps, erosion, perforation             Throat- Mallampati II , mucosa clear , drainage- none, tonsils- atrophic Neck- flexible , trachea midline, no stridor , thyroid nl, carotid no bruit Chest - symmetrical excursion , unlabored           Heart/CV- RRR , no murmur , no gallop  , no rub, nl s1 s2                           -  JVD- none , edema- none, stasis changes- none, varices- none           Lung- clear to P&A, wheeze- none, cough- none , dullness-none, rub- none           Chest wall-  Abd-  Br/ Gen/ Rectal- Not done, not indicated Extrem- cyanosis- none, clubbing, none, atrophy- none, strength- nl, +spider veins Neuro- grossly intact to observation

## 2019-06-08 ENCOUNTER — Other Ambulatory Visit: Payer: Self-pay | Admitting: Family Medicine

## 2019-06-08 DIAGNOSIS — M8580 Other specified disorders of bone density and structure, unspecified site: Secondary | ICD-10-CM

## 2019-06-08 DIAGNOSIS — Z1231 Encounter for screening mammogram for malignant neoplasm of breast: Secondary | ICD-10-CM

## 2019-09-01 ENCOUNTER — Ambulatory Visit: Payer: Medicare Other

## 2019-09-01 ENCOUNTER — Other Ambulatory Visit: Payer: Medicare Other

## 2019-09-10 ENCOUNTER — Ambulatory Visit: Payer: Medicare Other | Attending: Internal Medicine

## 2019-09-10 DIAGNOSIS — Z23 Encounter for immunization: Secondary | ICD-10-CM | POA: Insufficient documentation

## 2019-09-10 NOTE — Progress Notes (Signed)
   Covid-19 Vaccination Clinic  Name:  AMENDA SERFASS    MRN: [id number] DOB: 13-Sep-1940  09/10/2019  Ms. Piatek was observed post Covid-19 immunization for 15 minutes without incidence. She was provided with Vaccine Information Sheet and instruction to access the V-Safe system.   Ms. Matteo was instructed to call 911 with any severe reactions post vaccine: Marland Kitchen Difficulty breathing  . Swelling of your face and throat  . A fast heartbeat  . A bad rash all over your body  . Dizziness and weakness

## 2019-09-29 ENCOUNTER — Ambulatory Visit: Payer: Medicare Other | Attending: Internal Medicine

## 2019-09-29 DIAGNOSIS — Z23 Encounter for immunization: Secondary | ICD-10-CM | POA: Insufficient documentation

## 2019-09-29 NOTE — Progress Notes (Signed)
   Covid-19 Vaccination Clinic  Name:  MAKAILEE STANAWAY    MRN: [id number] DOB: [birth year]  09/29/2019  Ms. Moody was observed post Covid-19 immunization for 15 minutes without incidence. She was provided with Vaccine Information Sheet and instruction to access the V-Safe system.   Ms. Lemerise was instructed to call 911 with any severe reactions post vaccine: Marland Kitchen Difficulty breathing  . Swelling of your face and throat  . A fast heartbeat  . A bad rash all over your body  . Dizziness and weakness    Immunizations Administered    Name Date Dose VIS Date Route   Pfizer COVID-19 Vaccine 09/29/2019  1:31 PM 0.3 mL 08/04/2019 Intramuscular   Manufacturer: [place]   Lot: [id number]   [place]: [id number]

## 2019-11-17 ENCOUNTER — Other Ambulatory Visit: Payer: Self-pay

## 2019-11-17 ENCOUNTER — Ambulatory Visit
Admission: RE | Admit: 2019-11-17 | Discharge: 2019-11-17 | Disposition: A | Discharge status: Home / Self Care | Payer: Medicare Other | Source: Ambulatory Visit | Attending: Family Medicine | Admitting: Family Medicine

## 2019-11-17 DIAGNOSIS — Z1231 Encounter for screening mammogram for malignant neoplasm of breast: Secondary | ICD-10-CM

## 2019-11-17 DIAGNOSIS — M8580 Other specified disorders of bone density and structure, unspecified site: Secondary | ICD-10-CM

## 2020-06-04 ENCOUNTER — Ambulatory Visit: Payer: Medicare Other | Attending: Internal Medicine

## 2020-06-04 DIAGNOSIS — Z23 Encounter for immunization: Secondary | ICD-10-CM

## 2020-06-04 NOTE — Progress Notes (Signed)
   Covid-19 Vaccination Clinic  Name:  Amanda Sims    MRN: [id number] DOB: [birth year]  06/04/2020  Amanda Sims was observed post Covid-19 immunization for 15 minutes without incident. She was provided with Vaccine Information Sheet and instruction to access the V-Safe system. Vaccinated By: Kristeen Miss.  Amanda Sims was instructed to call 911 with any severe reactions post vaccine: Marland Kitchen Difficulty breathing  . Swelling of face and throat  . A fast heartbeat  . A bad rash all over body  . Dizziness and weakness

## 2020-06-07 ENCOUNTER — Other Ambulatory Visit: Payer: Self-pay

## 2020-06-07 ENCOUNTER — Ambulatory Visit: Payer: Medicare Other | Admitting: Internal Medicine

## 2020-06-07 ENCOUNTER — Ambulatory Visit (INDEPENDENT_AMBULATORY_CARE_PROVIDER_SITE_OTHER): Payer: Medicare Other

## 2020-06-07 ENCOUNTER — Encounter: Payer: Self-pay | Admitting: Internal Medicine

## 2020-06-07 VITALS — BP 162/90 | HR 73 | Temp 94.2°F | Ht 64.0 in | Wt 157.0 lb

## 2020-06-07 DIAGNOSIS — J479 Bronchiectasis, uncomplicated: Secondary | ICD-10-CM | POA: Diagnosis not present

## 2020-06-07 DIAGNOSIS — A310 Pulmonary mycobacterial infection: Secondary | ICD-10-CM

## 2020-06-07 NOTE — Patient Instructions (Signed)
Order- CXR  Dx bronchiectasis uncomplicated  I'm glad you are doing so well  Please call if we can help

## 2020-06-07 NOTE — Progress Notes (Signed)
HPI female never smoker with MAIC, bronchiectasis, hemoptysis, DOE, complicated by HBP She she had an episode of cough with hemoptysis in January of 2011 and was diagnosed with Mycobacterium avium. Her pulmonologist in [place] followed her conservatively. There was another brief episode of self-limited hemoptysis in March of 2012. She has had bronchoscopy x2. CT chest without contrast on 02/15/2011 showed areas of reticular nodularity and scarring in the right middle lobe, PFT 07/09/2011-mild obstructive airways disease with minimal response to bronchodilator. Air-trapping confirming obstruction. Normal diffusion. FEV1 1.84/89%, FEV1/FVC 0.71, FEF 25-75% 1.06/45% after bronchodilator. Office Spirometry 11/22/2015-mild obstructive airways disease. FVC 2.14/76%, FEV1 1.49/70%, FEV1/FVC 0.70, FEF 25-75 percent 0.85/46%. -----------------------------------------------------------------------------------------------------------  06/02/2019- 79 year old female never smoker with MAIC, bronchiectasis, hemoptysis, DOE -----pt states breathing is generally at baseline; reports mucus upon waking, which she takes Zyrtec for Occasional mild morning cough, only scant clear mucus, no fever or sweats. Note Lisinopril. Will get flu vax at PCP office.  Some seasonal drainage, little sneeze.  Covid discussion. CXR 01/19/18- COPD. Evidence of previous granulomatous infection. No acute pneumonia nor CHF. Thoracic aortic atherosclerosis.  06/07/20- 79 year old female never smoker with MAIC, bronchiectasis, hemoptysis, DOE, complicated by Obesity Covid vax- 3 Phiizer Flu vax- next PCP visit Doing well. Coping with husband's renal failure. Minimal cough, scant clear phlegm, no night sweats or adenopathy. No respiratory exacerbations. She accepts that cough might be from postnasal drip or lisinopril. We will update CXR, but if stable we agreed she can follow with PCP and we will be happy to see her again if  needed. CXR 06/02/2019-  No active cardiopulmonary disease.  No interval change from prior.   ROS-see HPI     += positive  Constitutional:   No-   weight loss, +night sweats,  No-fevers, chills, fatigue, lassitude. HEENT:   No-  headaches, difficulty swallowing, tooth/dental problems, sore throat,       No-  sneezing, itching, ear ache, nasal congestion, post nasal drip,  CV:  No-   chest pain, orthopnea, PND, swelling in lower extremities, anasarca, dizziness, palpitations Resp: +shortness of breath with exertion or at rest.            + productive cough,  No non-productive cough,  No- coughing up of blood.              No-   change in color of mucus.  No- wheezing.   Skin: No-   rash or lesions. GI:  No-   heartburn, indigestion, abdominal pain, nausea, vomiting,  GU: MS:  No-   joint pain or swelling.  Neuro-     nothing unusual Psych:  No- change in mood or affect. No depression or anxiety.  No memory loss.  OBJ    stable baseline exam General- Alert, Oriented, Affect-appropriate, Distress- none acute; + overweight, well-appearing.     Husband here Skin- rash-none, lesions- none, excoriation- none Lymphadenopathy- none Head- atraumatic            Eyes- Gross vision intact, PERRLA, conjunctivae clear secretions            Ears-+ mild hard of hearing            Nose- Clear, no-Septal dev, mucus, polyps, erosion, perforation             Throat- Mallampati II , mucosa clear , drainage- none, tonsils- atrophic Neck- flexible , trachea midline, no stridor , thyroid nl, carotid no bruit Chest - symmetrical excursion , unlabored  Heart/CV- RRR , no murmur , no gallop  , no rub, nl s1 s2                           - JVD- none , edema- none, stasis changes- none, varices- none           Lung- clear to P&A, wheeze- none, cough- none , dullness-none, rub- none           Chest wall-  Abd-  Br/ Gen/ Rectal- Not done, not indicated Extrem- cyanosis- none, clubbing, none, atrophy-  none, strength- nl, +spider veins Neuro- grossly intact to observation

## 2020-06-07 NOTE — Assessment & Plan Note (Signed)
No evident progression and likely inactive now We don't anticipate need for intervention

## 2020-06-07 NOTE — Assessment & Plan Note (Signed)
No exacerbation, clinically very quiet now Plan- update CXR

## 2020-06-10 NOTE — Progress Notes (Signed)
Called and spoke with patient about xray results per Dr Young. All questions answered and patient expressed full understanding. Nothing further needed at this time.

## 2020-06-12 ENCOUNTER — Other Ambulatory Visit (HOSPITAL_BASED_OUTPATIENT_CLINIC_OR_DEPARTMENT_OTHER): Payer: Self-pay | Admitting: Internal Medicine

## 2020-06-12 MED FILL — PFIZER-BIONTECH COVID-19 VA: 30 | 1 days supply | Qty: 0 | Fill #0

## 2021-06-18 DIAGNOSIS — E559 Vitamin D deficiency, unspecified: Secondary | ICD-10-CM | POA: Diagnosis not present

## 2021-06-18 DIAGNOSIS — J479 Bronchiectasis, uncomplicated: Secondary | ICD-10-CM | POA: Diagnosis not present

## 2021-06-18 DIAGNOSIS — E7800 Pure hypercholesterolemia, unspecified: Secondary | ICD-10-CM | POA: Diagnosis not present

## 2021-06-18 DIAGNOSIS — Z0000 Encounter for general adult medical examination without abnormal findings: Secondary | ICD-10-CM | POA: Diagnosis not present

## 2021-06-18 DIAGNOSIS — I10 Essential (primary) hypertension: Secondary | ICD-10-CM | POA: Diagnosis not present

## 2021-06-18 DIAGNOSIS — Z23 Encounter for immunization: Secondary | ICD-10-CM | POA: Diagnosis not present

## 2021-06-24 ENCOUNTER — Telehealth: Payer: Self-pay | Admitting: Orthopaedic Surgery

## 2021-06-24 NOTE — Telephone Encounter (Signed)
Spoke to pt about putting HHPT referral order in for her husband, Jori Moll, who the phone call was in reference for.

## 2021-06-24 NOTE — Telephone Encounter (Signed)
Pt would like a phone call from [place].   CB [phone]

## 2021-06-30 ENCOUNTER — Emergency Department (HOSPITAL_BASED_OUTPATIENT_CLINIC_OR_DEPARTMENT_OTHER): Payer: Medicare Other | Admitting: Radiology

## 2021-06-30 ENCOUNTER — Other Ambulatory Visit: Payer: Self-pay

## 2021-06-30 ENCOUNTER — Emergency Department (HOSPITAL_BASED_OUTPATIENT_CLINIC_OR_DEPARTMENT_OTHER)
Admission: EM | Admit: 2021-06-30 | Discharge: 2021-06-30 | Disposition: A | Discharge status: Home / Self Care | Payer: Medicare Other | Attending: Emergency Medicine | Admitting: Emergency Medicine

## 2021-06-30 ENCOUNTER — Encounter (HOSPITAL_BASED_OUTPATIENT_CLINIC_OR_DEPARTMENT_OTHER): Payer: Self-pay

## 2021-06-30 DIAGNOSIS — M25552 Pain in left hip: Secondary | ICD-10-CM | POA: Diagnosis not present

## 2021-06-30 DIAGNOSIS — Z743 Need for continuous supervision: Secondary | ICD-10-CM | POA: Diagnosis not present

## 2021-06-30 DIAGNOSIS — I10 Essential (primary) hypertension: Secondary | ICD-10-CM | POA: Diagnosis not present

## 2021-06-30 DIAGNOSIS — R109 Unspecified abdominal pain: Secondary | ICD-10-CM | POA: Diagnosis not present

## 2021-06-30 DIAGNOSIS — M545 Low back pain, unspecified: Secondary | ICD-10-CM | POA: Diagnosis not present

## 2021-06-30 DIAGNOSIS — W109XXA Fall (on) (from) unspecified stairs and steps, initial encounter: Secondary | ICD-10-CM | POA: Diagnosis not present

## 2021-06-30 DIAGNOSIS — S61412A Laceration without foreign body of left hand, initial encounter: Secondary | ICD-10-CM | POA: Diagnosis not present

## 2021-06-30 DIAGNOSIS — M79642 Pain in left hand: Secondary | ICD-10-CM | POA: Diagnosis not present

## 2021-06-30 DIAGNOSIS — M549 Dorsalgia, unspecified: Secondary | ICD-10-CM | POA: Diagnosis not present

## 2021-06-30 DIAGNOSIS — W19XXXA Unspecified fall, initial encounter: Secondary | ICD-10-CM | POA: Diagnosis not present

## 2021-06-30 MED ORDER — OXYCODONE-ACETAMINOPHEN 5-325 MG PO TABS
1.0000 | ORAL_TABLET | Freq: Once | ORAL | Status: AC
  Administered 2021-06-30: 1 via ORAL
  Filled 2021-06-30: qty 1

## 2021-06-30 NOTE — ED Notes (Signed)
Pt up to bathroom w/stand by assist

## 2021-06-30 NOTE — ED Triage Notes (Signed)
Patient BIB [facility] from Home after Fall.  Patient was ambulating down Stairs too quickly when her Foot slipped and she fell unto Floor.   Patient has Tiny 1-2 cm Skin Tear to Right Posterior Hand with Bruising. Patient also has Pain to left Flank.   BIB Stretcher/Wheelchair. NAD Noted during Triage. A&Ox4. GCS 15.

## 2021-06-30 NOTE — ED Notes (Signed)
[facility]PA Provider at bedside. 

## 2021-06-30 NOTE — ED Provider Notes (Signed)
[facility] EMERGENCY DEPT Provider Note   CSN: [id number] Arrival date & time: 06/30/21  1356     History Chief Complaint  Patient presents with   Amanda Sims is a 80 y.o. female with a past medical history significant for osteopenia who presents after having a fall earlier today.  Patient reports she slid down the last 2-3 stairs, fell on left hand, and left flank.  Patient did not hit her head, did not lose consciousness.  Denies any numbness, tingling in hands, or legs.  Patient does have a small skin tear on her left hand with a bandage in place at time of initial evaluation.  He has no confusion, dizziness, nausea, vomiting.  No facial droop, weakness.  Patient has not taken anything for pain prior to arrival, rates pain as 7 out of 10.   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     No family history on file.  Social History   Tobacco Use   Smoking status: Never   Smokeless tobacco: Never  Vaping Use   Vaping Use: Never used  Substance Use Topics   Alcohol use: Yes   Drug use: No    Home Medications Prior to Admission medications   Medication Sig Start Date End Date Taking? Authorizing Provider  acetaminophen (TYLENOL ARTHRITIS PAIN) 650 MG CR tablet Take 650 mg by mouth as needed for pain.    [provider]  amLODipine (NORVASC) 5 MG tablet Take 10 mg by mouth daily.     [provider]  cetirizine (ZYRTEC) 10 MG tablet Take 10  mg by mouth daily.    [provider]  DULoxetine (CYMBALTA) 60 MG capsule Take 1 capsule by mouth daily. 11/13/15   [provider]  fluticasone (FLONASE) 50 MCG/ACT nasal spray Place 1 spray into both nostrils as needed.  Patient not taking: Reported on 06/07/2020 09/09/15   [provider]  lisinopril (PRINIVIL,ZESTRIL) 40 MG tablet Take 1 tablet by mouth daily. 11/12/15   [provider]  metoprolol (TOPROL-XL) 50 MG 24 hr tablet Take 50 mg by mouth daily.      [provider]  Multiple Vitamin (MULTIVITAMIN) tablet Take 1 tablet by mouth daily.      [provider]  omeprazole (PRILOSEC) 20 MG capsule Take 20 mg by mouth daily.      [provider]    Allergies    Patient has no known allergies.  Review of Systems   Review of Systems  Musculoskeletal:  Positive for back pain.  All other systems reviewed and are negative.  Physical Exam Updated Vital Signs BP (!) 157/98 (BP Location: Right Arm)   Pulse 86   Temp 97.9 F (36.6 C) (Oral)   Resp 16   Ht 5\' 4"  (1.626 m)   Wt 71.2 kg   SpO2 98%   BMI 26.94 kg/m   Physical Exam Vitals and nursing note  reviewed.  Constitutional:      General: She is not in acute distress.    Appearance: Normal appearance.  HENT:     Head: Normocephalic and atraumatic.  Eyes:     General:        Right eye: No discharge.        Left eye: No discharge.  Cardiovascular:     Rate and Rhythm: Normal rate and regular rhythm.     Pulses: Normal pulses.     Heart sounds: No murmur heard.   No friction rub. No gallop.     Comments: Intact DP, PT pulses bilateral lower extremities.  Intact radial, ulnar pulses bilateral upper extremities. Pulmonary:     Effort: Pulmonary effort is normal.     Breath sounds: Normal breath sounds.  Abdominal:     General: Bowel sounds are normal.     Palpations: Abdomen is soft.  Musculoskeletal:     Cervical back: Neck supple. No rigidity.      Comments: Some tenderness to palpation left flank, no midline spinal tenderness throughout entire spine.  Intact range of motion, intact gait, 5 out of 5 strength bilateral upper and lower extremities.  Some tenderness to palpation left hand where skin tear is located, however patient has intact flexion extension of all fingers, can make a tight fist.  Skin:    General: Skin is warm and dry.     Capillary Refill: Capillary refill takes less than 2 seconds.  Neurological:     Mental Status: She is alert and oriented to person, place, and time.  Psychiatric:        Mood and Affect: Mood normal.        Behavior: Behavior normal.    ED Results / Procedures / Treatments   Labs (all labs ordered are listed, but only abnormal results are displayed) Labs Reviewed - No data to display  EKG None  Radiology DG Hand Complete Left  Result Date: 06/30/2021 CLINICAL DATA:  Fall.  Skin tear to dorsal left hand. EXAM: LEFT HAND - COMPLETE 3+ VIEW COMPARISON:  None. FINDINGS: No evidence of fracture, dislocation, or joint effusion. Severe degenerative changes of the first carpometacarpal joint. Degenerative changes of the carpal bones. Severe degenerative changes of the second digit distal interphalangeal joint with associated subluxation. No aggressive appearing focal bone abnormality. Soft tissues are unremarkable. IMPRESSION: 1. No acute displaced fracture or dislocation. 2. Severe degenerative changes. Electronically Signed   By: Iven Finn M.D.   On: 06/30/2021 16:57   DG Hip Unilat With Pelvis 2-3 Views Left  Result Date: 06/30/2021 CLINICAL DATA:  Fall.  Pain to left iliac crest. EXAM: DG HIP (WITH OR WITHOUT PELVIS) 2-3V LEFT COMPARISON:  None FINDINGS: There is no evidence of hip fracture or dislocation. Mild bilateral joint space narrowing with subchondral sclerosis and marginal spur formation noted compatible with degenerative joint disease. IMPRESSION: 1. No acute findings. 2. Mild bilateral  hip osteoarthritis. Electronically Signed   By: Kerby Moors M.D.   On: 06/30/2021 16:57    Procedures Procedures   Medications Ordered in ED Medications  oxyCODONE-acetaminophen (PERCOCET/ROXICET) 5-325 MG per tablet 1 tablet (1 tablet Oral Given 06/30/21 1445)    ED Course  I have reviewed the triage vital signs and the nursing notes.  Pertinent labs & imaging results that were available during my care of the patient were reviewed by me and considered in my medical decision making (see chart for details).    MDM Rules/Calculators/A&P  Elderly patient with history of osteopenia sustained mechanical, nonsyncopal fall.  Patient with some tenderness to the left flank, radiographic imaging of the left hip shows no injury.  Patient has no bruising to that area, no significant tenderness to palpation.  No evidence of deformity or step-off on spine, ambulates without difficulty.  Very small shallow skin tear on left hand, not actively bleeding.  Wound was cleaned, rebandaged.  Encourage patient to bandage daily with antibacterial ointment.  Encouraged her to watch for signs of infection.  Patient discharged in stable condition, neurovascularly intact throughout, no occult fracture suspected.  No occult head injury, neck injury suspected.  Return precautions given. Final Clinical Impression(s) / ED Diagnoses Final diagnoses:  Fall, initial encounter    Rx / DC Orders ED Discharge Orders     None        Anselmo Pickler, PA-C 06/30/21 [place], DO 07/01/21 [phone]

## 2021-06-30 NOTE — ED Provider Notes (Signed)
Emergency Medicine Provider Triage Evaluation Note  Amanda Sims , a 80 y.o. female  was evaluated in triage.  Pt complains of left flank pain, skin tear of left hand after fall down 2-3 stairs. Not taking blood thinner. Did not hit head, did not lose consciousness. No numbness, tingling.  Review of Systems  Positive: As above Negative: As above  Physical Exam  BP (!) 152/84 (BP Location: Right Arm)   Pulse 76   Temp 98.1 F (36.7 C)   Resp 14   Ht 5\' 4"  (1.626 m)   Wt 71.2 kg   SpO2 97%   BMI 26.94 kg/m  Gen:   Awake, no distress   Resp:  Normal effort  MSK:   Moves extremities without difficulty  Other:  Skin tear left hand, minimal ttp left flank, no rib pain, no midline spinal tenderness  Medical Decision Making  Medically screening exam initiated at 2:40 PM.  Appropriate orders placed.  Amanda Sims was informed that the remainder of the evaluation will be completed by another provider, this initial triage assessment does not replace that evaluation, and the importance of remaining in the ED until their evaluation is complete.  Fall, left flank pain   Anselmo Pickler, [place] 06/30/21 1442    Tegeler, Gwenyth Allegra, MD 06/30/21 [phone]

## 2021-06-30 NOTE — Discharge Instructions (Addendum)
Please use Tylenol or ibuprofen for pain.  You may use 600 mg ibuprofen every 6 hours or 1000 mg of Tylenol every 6 hours.  You may choose to alternate between the 2.  This would be most effective.  Not to exceed 4 g of Tylenol within 24 hours.  Not to exceed 3200 mg ibuprofen 24 hours.  Follow up with orthopedics as needed if pain worsens or fails to improve.

## 2021-07-02 ENCOUNTER — Encounter: Payer: Self-pay | Admitting: Family Medicine

## 2021-07-02 ENCOUNTER — Ambulatory Visit (HOSPITAL_BASED_OUTPATIENT_CLINIC_OR_DEPARTMENT_OTHER)
Admission: RE | Admit: 2021-07-02 | Discharge: 2021-07-02 | Disposition: A | Discharge status: Home / Self Care | Payer: Medicare Other | Source: Ambulatory Visit | Attending: Family Medicine | Admitting: Family Medicine

## 2021-07-02 ENCOUNTER — Other Ambulatory Visit: Payer: Self-pay

## 2021-07-02 ENCOUNTER — Ambulatory Visit: Payer: Medicare Other | Admitting: Family Medicine

## 2021-07-02 VITALS — BP 112/86 | Ht 64.0 in | Wt 146.0 lb

## 2021-07-02 DIAGNOSIS — M545 Low back pain, unspecified: Secondary | ICD-10-CM | POA: Diagnosis not present

## 2021-07-02 DIAGNOSIS — M7918 Myalgia, other site: Secondary | ICD-10-CM

## 2021-07-02 NOTE — Progress Notes (Signed)
  Amanda Sims - 80 y.o. female MRN [id number]  Date of birth: 27-Nov-1940  SUBJECTIVE:  Including CC & ROS.  No chief complaint on file.   Amanda Sims is a 80 y.o. female that is presenting with left gluteal pain following a fall she had a few days ago.  The pain has improved and her function has improved.  No history of surgery in the area.  Has been alternating Tylenol and ibuprofen..  Independent review of the left hip x-ray from 11/7 shows no acute changes.  Independent review of the left hand x-ray from 11/7 shows severe degenerative changes of the DIP joints of the second digit.   Review of Systems See HPI   HISTORY: Past Medical, Surgical, Social, and Family History Reviewed & Updated per EMR.   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 No pertinent family history.  Social History   Socioeconomic History   Marital status: Married    Spouse name: Not on file   Number of children: Not on file   Years of education: Not on file   Highest education level: Not on file  Occupational History   Not on file  Tobacco Use   Smoking status: Never   Smokeless tobacco: Never  Vaping Use   Vaping Use: Never used  Substance and Sexual Activity   Alcohol use: Yes   Drug use: No   Sexual activity: Not on file  Other Topics Concern   Not on file  Social History Narrative   Not on file   Social Determinants of Health   Financial Resource Strain: Not on file  Food Insecurity: Not on file  Transportation Needs: Not on file  Physical Activity: Not on file  Stress: Not on file  Social Connections: Not on file  Intimate Partner Violence: Not on file     PHYSICAL EXAM:  VS: BP 112/86  (BP Location: Left Arm, Patient Position: Sitting)   Ht 5\' 4"  (1.626 m)   Wt 146 lb (66.2 kg)   BMI 25.06 kg/m  Physical Exam Gen: NAD, alert, cooperative with exam, well-appearing      ASSESSMENT & PLAN:   Gluteal pain Initial injury on 11/7 after a mechanical fall.  Having pain over the origin of the gluteus medius.  No changes observed on pelvic x-ray and pain is improving.  Seems most consistent with bruising at this point. -Counseled on home exercise therapy and supportive care. -Check lumbar x-ray with history of osteopenia and fall. -Could consider injection or physical therapy.

## 2021-07-02 NOTE — Patient Instructions (Signed)
Nice to meet you Please try heat  Please continue alternating ibuprofen and tylenol  Please try the stretches  I will call with the results from today   Please send me a message in [place] with any questions or updates.  Please see me back in 7-10 days.   --Dr. Raeford Razor

## 2021-07-02 NOTE — Assessment & Plan Note (Signed)
Initial injury on 11/7 after a mechanical fall.  Having pain over the origin of the gluteus medius.  No changes observed on pelvic x-ray and pain is improving.  Seems most consistent with bruising at this point. -Counseled on home exercise therapy and supportive care. -Check lumbar x-ray with history of osteopenia and fall. -Could consider injection or physical therapy.

## 2021-07-07 ENCOUNTER — Telehealth: Payer: Self-pay | Admitting: Family Medicine

## 2021-07-07 NOTE — Telephone Encounter (Signed)
Left VM for patient. If she calls back please have her speak with a nurse/CMA and inform her that her lumbar x-ray is showing degenerative changes but no fracture.  Continue with plan that we discussed..   If any questions then please take the best time and phone number to call and I will try to call her back.   Rosemarie Ax, MD [facility] 07/07/2021, 3:41 PM

## 2021-07-07 NOTE — Telephone Encounter (Signed)
Pt informed of below.  Patient wishes to cancel her 07/11/21 ov at this time, as she is feeling better. She will continue her home exercises and call us if she needs to be rescheduled.

## 2021-07-11 ENCOUNTER — Ambulatory Visit: Payer: Medicare Other | Admitting: Family Medicine

## 2021-08-11 DIAGNOSIS — R059 Cough, unspecified: Secondary | ICD-10-CM | POA: Diagnosis not present

## 2021-09-19 IMAGING — DX DG CHEST 2V
2 series · 2 of 2 positions shown · non-contrast
Comparison: 06/02/2019 chest radiograph.

CLINICAL DATA: Bronchiectasis

EXAM:
CHEST - 2 VIEW

[chest pa]
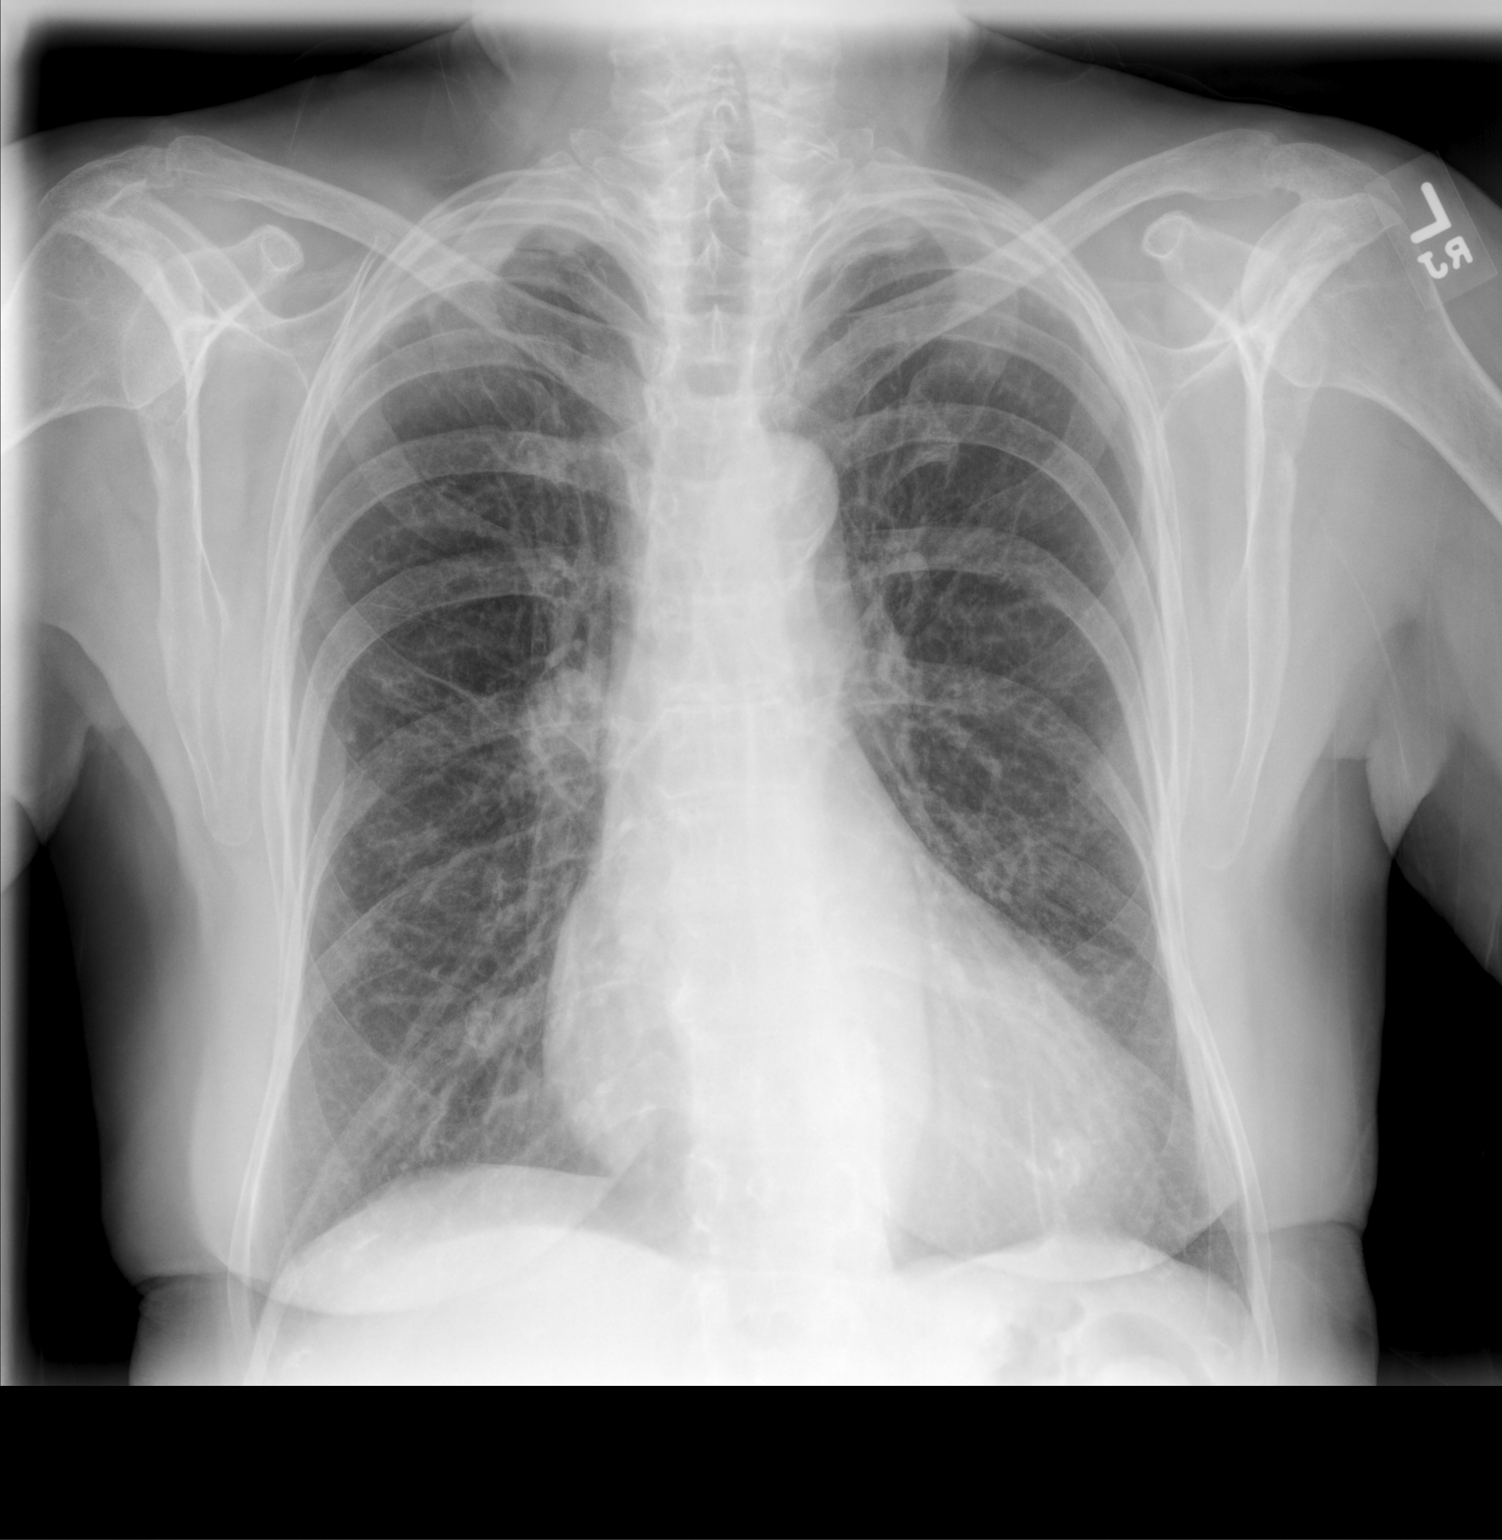

[chest lat]
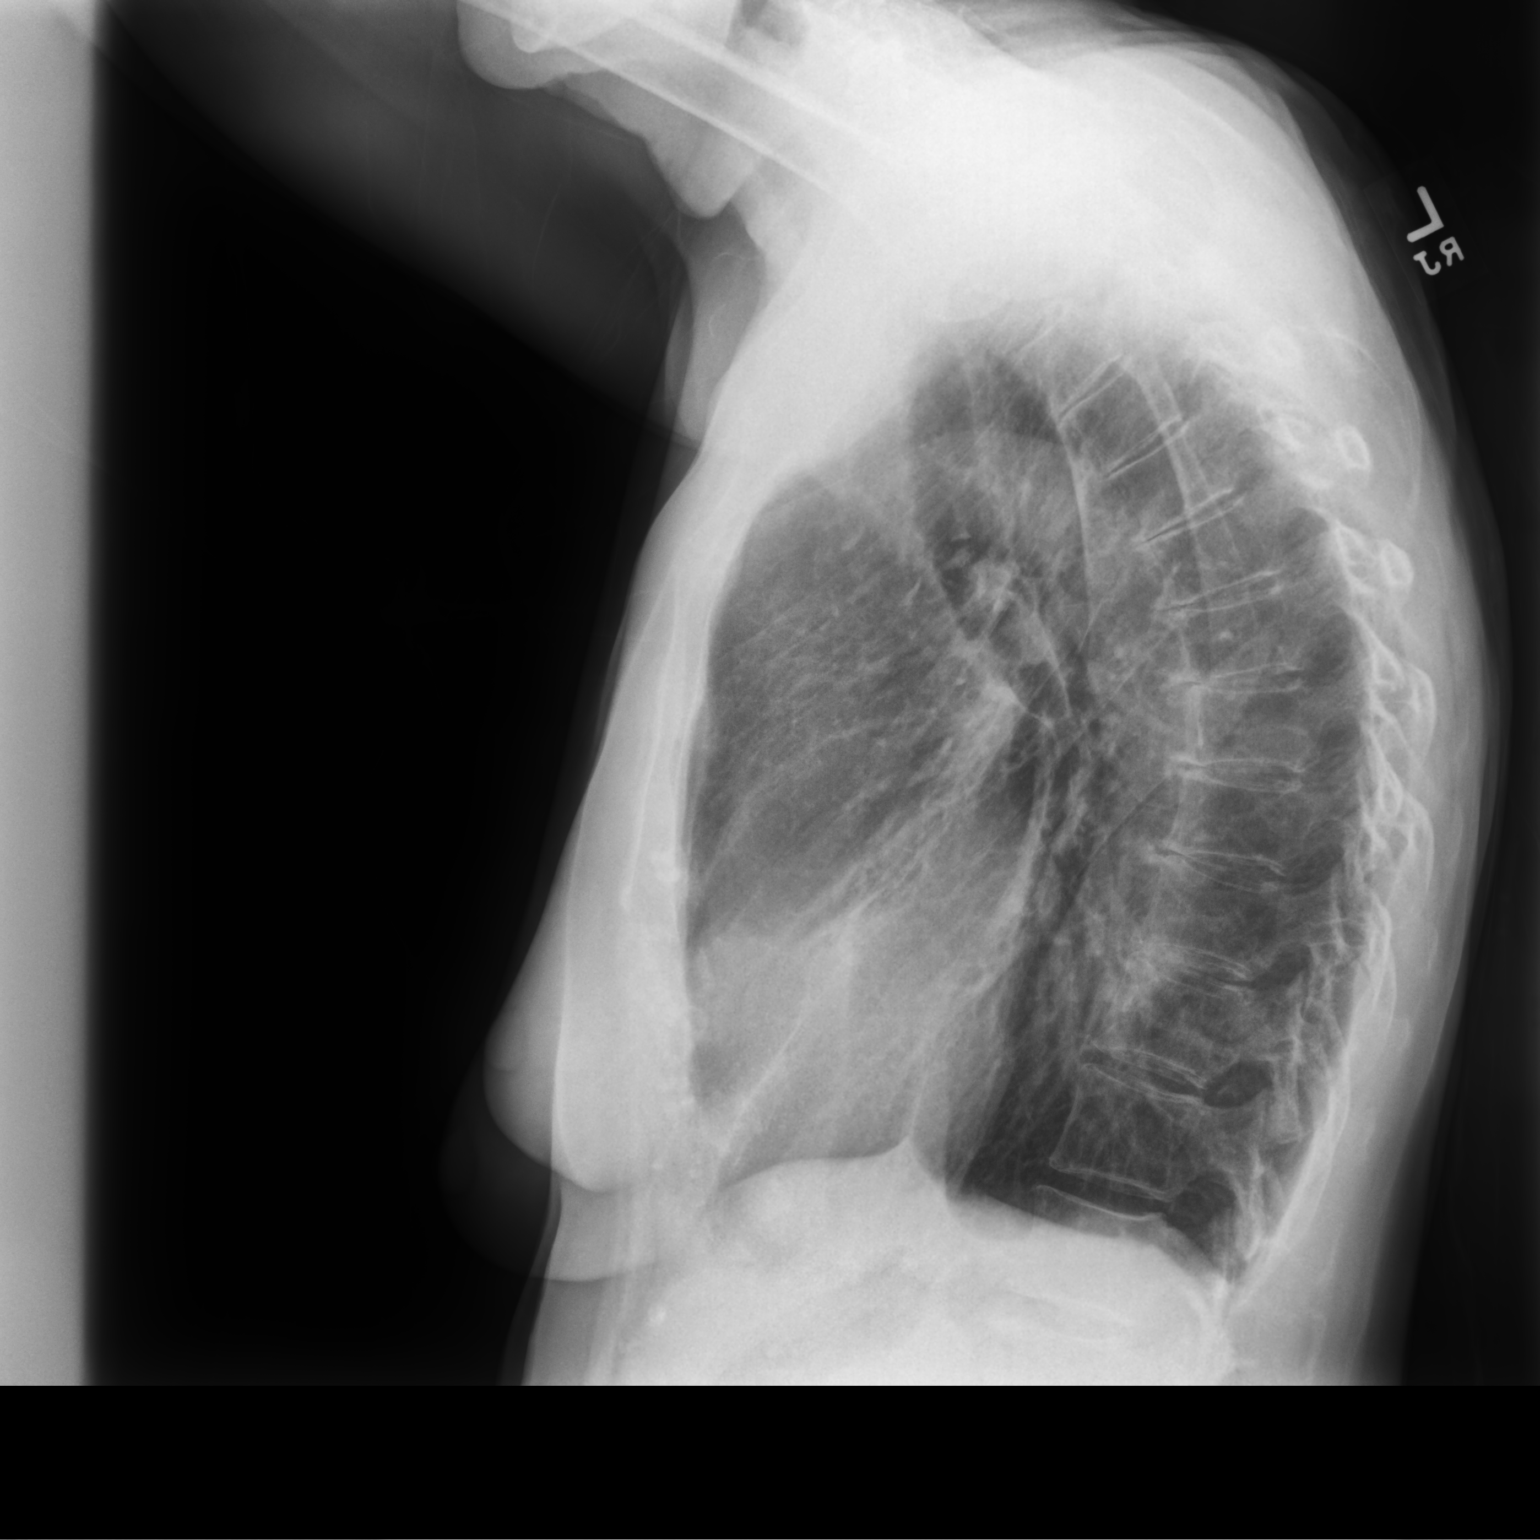

[2 of 2 positions shown; findings below may reference images not displayed]

FINDINGS: Stable cardiomediastinal silhouette with normal heart size. No
pneumothorax. No pleural effusion. Hyperinflated lungs. No pulmonary
edema. No acute consolidative airspace disease. Stable mild biapical
pleural-parenchymal scarring.
IMPRESSION: Hyperinflated lungs, suggesting obstructive lung disease. Otherwise
no active cardiopulmonary disease.

## 2021-10-09 DIAGNOSIS — R059 Cough, unspecified: Secondary | ICD-10-CM | POA: Diagnosis not present

## 2021-10-17 NOTE — Progress Notes (Signed)
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 Her pulmonologist in [place] followed her conservatively. There was another brief episode of self-limited hemoptysis in March of 2012. She has had bronchoscopy x2. CT chest without contrast on 02/15/2011 showed areas of reticular nodularity and scarring in the right middle lobe, PFT 07/09/2011-mild obstructive airways disease with minimal response to bronchodilator. Air-trapping confirming obstruction. Normal diffusion. FEV1 1.84/89%, FEV1/FVC 0.71, FEF 25-75% 1.06/45% after bronchodilator. Office Spirometry 11/22/2015-mild obstructive airways disease. FVC 2.14/76%, FEV1 1.49/70%, FEV1/FVC 0.70, FEF 25-75 percent 0.85/46%. -----------------------------------------------------------------------------------------------------------   06/07/20- 81 year old female never smoker with MAIC, bronchiectasis, hemoptysis, DOE, complicated by Obesity Covid vax- 3 Phiizer Flu vax- next PCP visit Doing well. Coping with husband's renal failure. Minimal cough, scant clear phlegm, no night sweats or adenopathy. No respiratory exacerbations. She accepts that cough might be from postnasal drip or lisinopril. We will update CXR, but if stable we agreed she can follow with PCP and we will be happy to see her again if needed. CXR 06/02/2019-  No active cardiopulmonary disease.  No interval change from prior.  10/20/21- 81 year old female never smoker with MAIC, Bronchiectasis( Dx 2011/ [place]),/ COPD,  hemoptysis, DOE, complicated by Obesity, HTN,  Covid vax- 3 Phiizer Flu vax-had -----Patient states that she was sick in Feb and went to see PCP. Patient states that she is feeling better. Shortness of breath with the stairs in her house.  Acute respiratory infection around January treated with Z-Pak.  Primary care thought she  might of had pneumonia but no chest x-ray was done.  Second similar infection in first week of February with cough, treated with repeat Z-Pak.  COVID tested once-negative.  Note CXR.  I little wheezing with acute illness but not now.  Notes dyspnea on exertion climbing stairs which may be somewhat worse than in past years without acute change.  Friends tell her she is "run down". CXR 06/07/20-  IMPRESSION: Hyperinflated lungs, suggesting obstructive lung disease. Otherwise no active cardiopulmonary disease.   ROS-see HPI     += positive  Constitutional:   No-   weight loss, +night sweats,  No-fevers, chills, fatigue, lassitude. HEENT:   No-  headaches, difficulty swallowing, tooth/dental problems, sore throat,       No-  sneezing, itching, ear ache, nasal congestion, post nasal drip,  CV:  No-   chest pain, orthopnea, PND, swelling in lower extremities, anasarca, dizziness, palpitations Resp: +shortness of breath with exertion or at rest.            + productive cough,  No non-productive cough,  No- coughing up of blood.              No-   change in color of mucus.  No- wheezing.   Skin: No-   rash or lesions. GI:  No-   heartburn, indigestion, abdominal pain, nausea, vomiting,  GU: MS:  No-   joint pain or swelling.  Neuro-     nothing unusual Psych:  No- change in mood or affect. No depression or anxiety.  No memory loss.  OBJ     General- Alert, Oriented, Affect-appropriate, Distress- none acute; well-appearing.     Husband here Skin- rash-none, lesions- none, excoriation- none Lymphadenopathy- none Head- atraumatic            Eyes- Gross vision intact, PERRLA, conjunctivae clear secretions  Ears-+ mild hard of hearing            Nose- Clear, no-Septal dev, mucus, polyps, erosion, perforation             Throat- Mallampati II , mucosa clear , drainage- none, tonsils- atrophic Neck- flexible , trachea midline, no stridor , thyroid nl, carotid no bruit Chest - symmetrical  excursion , unlabored           Heart/CV- RRR , no murmur , no gallop  , no rub, nl s1 s2                           - JVD- none , edema- none, stasis changes- none, varices- none           Lung- clear to P&A, wheeze- none, cough- none , dullness-none, rub- none           Chest wall-  Abd-  Br/ Gen/ Rectal- Not done, not indicated Extrem- cyanosis- none, clubbing, none, atrophy- none, strength- nl, +spider veins Neuro- grossly intact to observation

## 2021-10-20 ENCOUNTER — Encounter: Payer: Self-pay | Admitting: Internal Medicine

## 2021-10-20 ENCOUNTER — Ambulatory Visit: Payer: Medicare Other | Admitting: Internal Medicine

## 2021-10-20 ENCOUNTER — Ambulatory Visit (INDEPENDENT_AMBULATORY_CARE_PROVIDER_SITE_OTHER): Payer: Medicare Other

## 2021-10-20 ENCOUNTER — Other Ambulatory Visit: Payer: Self-pay

## 2021-10-20 VITALS — BP 130/78 | HR 74 | Temp 97.6°F | Ht 64.0 in | Wt 156.4 lb

## 2021-10-20 DIAGNOSIS — A310 Pulmonary mycobacterial infection: Secondary | ICD-10-CM | POA: Diagnosis not present

## 2021-10-20 DIAGNOSIS — I517 Cardiomegaly: Secondary | ICD-10-CM | POA: Diagnosis not present

## 2021-10-20 DIAGNOSIS — J479 Bronchiectasis, uncomplicated: Secondary | ICD-10-CM

## 2021-10-20 DIAGNOSIS — I700 Atherosclerosis of aorta: Secondary | ICD-10-CM | POA: Diagnosis not present

## 2021-10-20 LAB — CBC WITH DIFFERENTIAL/PLATELET
Basophils Absolute: 0.1 10*3/uL (ref 0.0–0.1)
Basophils Relative: 1.4 % (ref 0.0–3.0)
Eosinophils Absolute: 0.2 10*3/uL (ref 0.0–0.7)
Eosinophils Relative: 2.6 % (ref 0.0–5.0)
HCT: 44.1 % (ref 36.0–46.0)
Hemoglobin: 14.4 g/dL (ref 12.0–15.0)
Lymphocytes Relative: 22.8 % (ref 12.0–46.0)
Lymphs Abs: 1.4 10*3/uL (ref 0.7–4.0)
MCHC: 32.7 g/dL (ref 30.0–36.0)
MCV: 101.5 fl — ABNORMAL HIGH (ref 78.0–100.0)
Monocytes Absolute: 0.6 10*3/uL (ref 0.1–1.0)
Monocytes Relative: 9.2 % (ref 3.0–12.0)
Neutro Abs: 3.9 10*3/uL (ref 1.4–7.7)
Neutrophils Relative %: 64 % (ref 43.0–77.0)
Platelets: 177 10*3/uL (ref 150.0–400.0)
RBC: 4.34 Mil/uL (ref 3.87–5.11)
RDW: 13 % (ref 11.5–15.5)
WBC: 6.1 10*3/uL (ref 4.0–10.5)

## 2021-10-20 LAB — BASIC METABOLIC PANEL
BUN: 16 mg/dL (ref 6–23)
CO2: 34 mEq/L — ABNORMAL HIGH (ref 19–32)
Calcium: 9.8 mg/dL (ref 8.4–10.5)
Chloride: 98 mEq/L (ref 96–112)
Creatinine, Ser: 0.7 mg/dL (ref 0.40–1.20)
GFR: 82.49 mL/min (ref >60.00)
Glucose, Bld: 96 mg/dL (ref 70–99)
Potassium: 4.3 mEq/L (ref 3.5–5.1)
Sodium: 136 mEq/L (ref 135–145)

## 2021-10-20 MED ORDER — STIOLTO RESPIMAT 2.5-2.5 MCG/ACT IN AERS: 2.0000 | INHALATION_SPRAY | Freq: Every day | RESPIRATORY_TRACT | 0 refills | Status: DC

## 2021-10-20 NOTE — Patient Instructions (Signed)
Order- CXR   dx bronchiectasis  Order - lab- cBC w diff, BMET    dx bronchiectasis  Order- sample Stiolto 1.25    inhale 1 puff, once daily     see if this helps your breathing

## 2021-10-20 NOTE — Assessment & Plan Note (Signed)
Watching for reactivation.  She does not routinely have a purulent cough.

## 2021-10-20 NOTE — Assessment & Plan Note (Signed)
2 acute exacerbations earlier this winter but she may be back close to baseline now.  We discussed options and agreed to reassess. Plan-CBC with differential, chemistry, chest x-ray, sample trial Stiolto

## 2021-10-21 ENCOUNTER — Encounter: Payer: Self-pay | Admitting: Internal Medicine

## 2021-11-05 ENCOUNTER — Telehealth: Payer: Self-pay | Admitting: Internal Medicine

## 2021-11-05 DIAGNOSIS — J479 Bronchiectasis, uncomplicated: Secondary | ICD-10-CM

## 2021-11-05 MED ORDER — STIOLTO RESPIMAT 2.5-2.5 MCG/ACT IN AERS: 2.0000 | INHALATION_SPRAY | Freq: Every day | RESPIRATORY_TRACT | 3 refills | Status: DC

## 2021-11-05 NOTE — Telephone Encounter (Signed)
Called patient and informed her that I did send in refills for her inhaler and attached some refills onto it for her. Verified pharmacy that patient would like it send it to. Nothing further noted.  ?

## 2021-11-26 ENCOUNTER — Telehealth: Payer: Self-pay | Admitting: Internal Medicine

## 2021-11-26 NOTE — Telephone Encounter (Signed)
Called and spoke with patient who states that she is having increased thirst with Stiolto. She states that she started medication in Feb when she was given a sample and then asked for RX to be sent in. She is not a diabetic and is not sure if this is normal but did read online that it can happen. ? ?Judson Roch please advise on behalf of Dr. Annamaria Boots ?

## 2021-11-26 NOTE — Telephone Encounter (Signed)
Called and spoke with patient to let her know of recs from Judson Roch. Patient expressed understanding. Patient states that she is not having any airway issues. She is breathing fine, no trouble swallowing etc. Told her to stop inhaler as of now and to pay attention to see if she continues to have excessive thirst and if she does between now and Monday for her to let us know and also call her PCP. Patient has been scheduled for OV with Dr. Annamaria Boots next week to talk about inhalers per Judson Roch. Nothing further needed at this time.  ?

## 2021-12-03 NOTE — Progress Notes (Signed)
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 Her pulmonologist in [place] followed her conservatively. There was another brief episode of self-limited hemoptysis in March of 2012. She has had bronchoscopy x2. CT chest without contrast on 02/15/2011 showed areas of reticular nodularity and scarring in the right middle lobe, ?PFT 07/09/2011-mild obstructive airways disease with minimal response to bronchodilator. Air-trapping confirming obstruction. Normal diffusion. FEV1 1.84/89%, FEV1/FVC 0.71, FEF 25-75% 1.06/45% after bronchodilator. ?Office Spirometry 11/22/2015-mild obstructive airways disease. FVC 2.14/76%, FEV1 1.49/70%, FEV1/FVC 0.70, FEF 25-75 percent 0.85/46%. ?----------------------------------------------------------------------------------------------------------- ?10/20/21- 81 year old female never smoker with MAIC, Bronchiectasis( Dx 2011/ [place]),/ COPD,  hemoptysis, DOE, complicated by Obesity, HTN,  ?Covid vax- 3 Phiizer ?Flu vax-had ?-----Patient states that she was sick in Feb and went to see PCP. Patient states that she is feeling better. Shortness of breath with the stairs in her house.  ?Acute respiratory infection around January treated with Z-Pak.  Primary care thought she might have had pneumonia but no chest x-ray was done.  Second similar infection in first week of February with cough, treated with repeat Z-Pak.  COVID tested once-negative.  Note CXR.  I little wheezing with acute illness but not now.  Notes dyspnea on exertion climbing stairs which may be somewhat worse than in past years without acute change.  Friends tell her she is "run down". ?CXR 06/07/20-  ?IMPRESSION: ?Hyperinflated lungs, suggesting obstructive lung disease. Otherwise ?no active cardiopulmonary disease. ? ?12/05/21- 81 year old female never smoker with MAIC,  Bronchiectasis( Dx 2011/ [place]),/ COPD,  hemoptysis, DOE, complicated by Obesity, HTN,  ?Covid vax- 3 Phiizer ?Flu vax-had ?She had reported Stiolto sample caused increased thirst. She had requested script on 3/15. NP had suggested Anoro trial if having "airway issues". ?She complains that LAMA therapies cause dry mouth.  Alternatives discussed. ?Little cough and no night sweats. ?CXR 10/22/21- ?IMPRESSION: ?1. No radiographic evidence of acute cardiopulmonary disease. ?2. Diffuse peribronchial cuffing, similar to the prior study, ?suggesting chronic bronchitis. No definitive bronchiectasis ?confidently identified radiographically. Further evaluation with ?high-resolution chest CT could be considered if clinically ?appropriate. ?3. Mild cardiomegaly. ?4. Aortic atherosclerosis. ? ? ?ROS-see HPI     += positive  ?Constitutional:   No-   weight loss, +night sweats,  No-fevers, chills, fatigue, lassitude. ?HEENT:   No-  headaches, difficulty swallowing, tooth/dental problems, sore throat,  ?     No-  sneezing, itching, ear ache, nasal congestion, post nasal drip,  ?CV:  No-   chest pain, orthopnea, PND, swelling in lower extremities, anasarca, dizziness, palpitations ?Resp: +shortness of breath with exertion or at rest.   ?         + productive cough,  No non-productive cough,  No- coughing up of blood.   ?           No-   change in color of mucus.  No- wheezing.   ?Skin: No-   rash or lesions. ?GI:  No-   heartburn, indigestion, abdominal pain, nausea, vomiting,  ?GU: ?MS:  No-   joint pain or swelling.  ?Neuro-     nothing unusual ?Psych:  No- change in mood or affect. No depression or anxiety.  No memory loss. ? ?OBJ     ?General- Alert, Oriented, Affect-appropriate, Distress- none acute; well-appearing.     ?Skin- rash-none, lesions- none, excoriation- none ?Lymphadenopathy- none ?Head- atraumatic ?  Eyes- Gross vision intact, PERRLA, conjunctivae clear secretions ?           Ears-+ mild hard of hearing ?            Nose- Clear, no-Septal dev, mucus, polyps, erosion, perforation  ?           Throat- Mallampati II , mucosa clear , drainage- none, tonsils- atrophic ?Neck- flexible , trachea midline, no stridor , thyroid nl, carotid no bruit ?Chest - symmetrical excursion , unlabored ?          Heart/CV- RRR , no murmur , no gallop  , no rub, nl s1 s2 ?                          - JVD- none , edema- none, stasis changes- none, varices- none ?          Lung- clear to P&A, wheeze- none, cough- none , dullness-none, rub- none ?          Chest wall-  ?Abd-  ?Br/ Gen/ Rectal- Not done, not indicated ?Extrem- cyanosis- none, clubbing, none, atrophy- none, strength- nl, +spider veins ?Neuro- grossly intact to observation ? ? ? ? ?

## 2021-12-05 ENCOUNTER — Ambulatory Visit: Payer: Medicare Other | Admitting: Internal Medicine

## 2021-12-05 ENCOUNTER — Encounter: Payer: Self-pay | Admitting: Internal Medicine

## 2021-12-05 DIAGNOSIS — I700 Atherosclerosis of aorta: Secondary | ICD-10-CM | POA: Diagnosis not present

## 2021-12-05 DIAGNOSIS — J479 Bronchiectasis, uncomplicated: Secondary | ICD-10-CM | POA: Diagnosis not present

## 2021-12-05 DIAGNOSIS — A310 Pulmonary mycobacterial infection: Secondary | ICD-10-CM

## 2021-12-05 NOTE — Patient Instructions (Addendum)
You can talk with your primary provider about whether to see cardiology about your enlarged heart and aortic atherosclerosis. ? ?If you feel that you are having more shortness of breath or wheezing, we can try a different inhaler without the drying medicine in it.  ? ?Ok to keep the appointment with [place] next February, unless you need [place] sooner. ?

## 2021-12-30 DIAGNOSIS — I700 Atherosclerosis of aorta: Secondary | ICD-10-CM | POA: Insufficient documentation

## 2021-12-30 NOTE — Assessment & Plan Note (Signed)
Unremarkable observation for age.  I explained that this says nothing about arterial obstruction. ?Plan-she will talk with her primary physician about any needed cardiac evaluation. ?

## 2021-12-30 NOTE — Assessment & Plan Note (Signed)
Chronic changes on chest x-ray but having significant active secretion management problems.  We will continue to watch, recognizing past history of atypical AFB/bronchiectasis ? ?

## 2021-12-30 NOTE — Assessment & Plan Note (Signed)
Not clinically active infection at this time.  She is not needing bronchodilators. ?Plan-continue to watch ?

## 2022-01-13 DIAGNOSIS — S52592A Other fractures of lower end of left radius, initial encounter for closed fracture: Secondary | ICD-10-CM | POA: Diagnosis not present

## 2022-01-13 DIAGNOSIS — S99921A Unspecified injury of right foot, initial encounter: Secondary | ICD-10-CM | POA: Diagnosis not present

## 2022-01-13 DIAGNOSIS — S6992XA Unspecified injury of left wrist, hand and finger(s), initial encounter: Secondary | ICD-10-CM | POA: Diagnosis not present

## 2022-01-13 DIAGNOSIS — M25532 Pain in left wrist: Secondary | ICD-10-CM | POA: Diagnosis not present

## 2022-01-13 DIAGNOSIS — S52692A Other fracture of lower end of left ulna, initial encounter for closed fracture: Secondary | ICD-10-CM | POA: Diagnosis not present

## 2022-01-20 DIAGNOSIS — M25532 Pain in left wrist: Secondary | ICD-10-CM | POA: Diagnosis not present

## 2022-02-05 DIAGNOSIS — S52502D Unspecified fracture of the lower end of left radius, subsequent encounter for closed fracture with routine healing: Secondary | ICD-10-CM | POA: Diagnosis not present

## 2022-02-05 DIAGNOSIS — M25532 Pain in left wrist: Secondary | ICD-10-CM | POA: Diagnosis not present

## 2022-02-05 DIAGNOSIS — S52602D Unspecified fracture of lower end of left ulna, subsequent encounter for closed fracture with routine healing: Secondary | ICD-10-CM | POA: Diagnosis not present

## 2022-03-03 DIAGNOSIS — S52622D Torus fracture of lower end of left ulna, subsequent encounter for fracture with routine healing: Secondary | ICD-10-CM | POA: Diagnosis not present

## 2022-03-03 DIAGNOSIS — S52502D Unspecified fracture of the lower end of left radius, subsequent encounter for closed fracture with routine healing: Secondary | ICD-10-CM | POA: Diagnosis not present

## 2022-03-03 DIAGNOSIS — M25532 Pain in left wrist: Secondary | ICD-10-CM | POA: Diagnosis not present

## 2022-03-30 DIAGNOSIS — L308 Other specified dermatitis: Secondary | ICD-10-CM | POA: Diagnosis not present

## 2022-03-30 DIAGNOSIS — L218 Other seborrheic dermatitis: Secondary | ICD-10-CM | POA: Diagnosis not present

## 2022-03-30 DIAGNOSIS — L820 Inflamed seborrheic keratosis: Secondary | ICD-10-CM | POA: Diagnosis not present

## 2022-03-30 DIAGNOSIS — D225 Melanocytic nevi of trunk: Secondary | ICD-10-CM | POA: Diagnosis not present

## 2022-04-02 DIAGNOSIS — S52502D Unspecified fracture of the lower end of left radius, subsequent encounter for closed fracture with routine healing: Secondary | ICD-10-CM | POA: Diagnosis not present

## 2022-04-02 DIAGNOSIS — S52622D Torus fracture of lower end of left ulna, subsequent encounter for fracture with routine healing: Secondary | ICD-10-CM | POA: Diagnosis not present

## 2022-05-14 DIAGNOSIS — S52502D Unspecified fracture of the lower end of left radius, subsequent encounter for closed fracture with routine healing: Secondary | ICD-10-CM | POA: Diagnosis not present

## 2022-05-14 DIAGNOSIS — S52622D Torus fracture of lower end of left ulna, subsequent encounter for fracture with routine healing: Secondary | ICD-10-CM | POA: Diagnosis not present

## 2022-05-14 DIAGNOSIS — M25532 Pain in left wrist: Secondary | ICD-10-CM | POA: Diagnosis not present

## 2022-05-18 ENCOUNTER — Emergency Department (HOSPITAL_COMMUNITY): Payer: Medicare Other

## 2022-05-18 ENCOUNTER — Other Ambulatory Visit: Payer: Self-pay

## 2022-05-18 ENCOUNTER — Encounter (HOSPITAL_COMMUNITY): Payer: Self-pay

## 2022-05-18 ENCOUNTER — Inpatient Hospital Stay (HOSPITAL_COMMUNITY)
Admission: EM | Admit: 2022-05-18 | Discharge: 2022-05-20 | DRG: 308 | Disposition: A | Discharge status: Home / Self Care | Payer: Medicare Other | Attending: Family Medicine | Admitting: Family Medicine

## 2022-05-18 DIAGNOSIS — E871 Hypo-osmolality and hyponatremia: Secondary | ICD-10-CM | POA: Diagnosis not present

## 2022-05-18 DIAGNOSIS — Z743 Need for continuous supervision: Secondary | ICD-10-CM | POA: Diagnosis not present

## 2022-05-18 DIAGNOSIS — A310 Pulmonary mycobacterial infection: Secondary | ICD-10-CM | POA: Diagnosis present

## 2022-05-18 DIAGNOSIS — E8729 Other acidosis: Secondary | ICD-10-CM

## 2022-05-18 DIAGNOSIS — I499 Cardiac arrhythmia, unspecified: Secondary | ICD-10-CM | POA: Diagnosis not present

## 2022-05-18 DIAGNOSIS — U071 COVID-19: Secondary | ICD-10-CM | POA: Diagnosis present

## 2022-05-18 DIAGNOSIS — E861 Hypovolemia: Secondary | ICD-10-CM | POA: Diagnosis not present

## 2022-05-18 DIAGNOSIS — R7401 Elevation of levels of liver transaminase levels: Secondary | ICD-10-CM | POA: Diagnosis not present

## 2022-05-18 DIAGNOSIS — E876 Hypokalemia: Secondary | ICD-10-CM | POA: Diagnosis not present

## 2022-05-18 DIAGNOSIS — R509 Fever, unspecified: Secondary | ICD-10-CM | POA: Diagnosis not present

## 2022-05-18 DIAGNOSIS — R0602 Shortness of breath: Secondary | ICD-10-CM | POA: Diagnosis not present

## 2022-05-18 DIAGNOSIS — R9389 Abnormal findings on diagnostic imaging of other specified body structures: Secondary | ICD-10-CM

## 2022-05-18 DIAGNOSIS — M8580 Other specified disorders of bone density and structure, unspecified site: Secondary | ICD-10-CM | POA: Diagnosis not present

## 2022-05-18 DIAGNOSIS — R531 Weakness: Secondary | ICD-10-CM | POA: Diagnosis not present

## 2022-05-18 DIAGNOSIS — Z79899 Other long term (current) drug therapy: Secondary | ICD-10-CM

## 2022-05-18 DIAGNOSIS — E872 Acidosis, unspecified: Secondary | ICD-10-CM | POA: Diagnosis present

## 2022-05-18 DIAGNOSIS — R5381 Other malaise: Secondary | ICD-10-CM | POA: Diagnosis not present

## 2022-05-18 DIAGNOSIS — J9811 Atelectasis: Secondary | ICD-10-CM | POA: Diagnosis not present

## 2022-05-18 DIAGNOSIS — R6889 Other general symptoms and signs: Secondary | ICD-10-CM | POA: Diagnosis not present

## 2022-05-18 DIAGNOSIS — I10 Essential (primary) hypertension: Secondary | ICD-10-CM | POA: Diagnosis not present

## 2022-05-18 DIAGNOSIS — R911 Solitary pulmonary nodule: Secondary | ICD-10-CM | POA: Diagnosis not present

## 2022-05-18 DIAGNOSIS — J479 Bronchiectasis, uncomplicated: Secondary | ICD-10-CM | POA: Diagnosis present

## 2022-05-18 DIAGNOSIS — I071 Rheumatic tricuspid insufficiency: Secondary | ICD-10-CM | POA: Diagnosis present

## 2022-05-18 DIAGNOSIS — I4891 Unspecified atrial fibrillation: Principal | ICD-10-CM | POA: Diagnosis present

## 2022-05-18 DIAGNOSIS — I700 Atherosclerosis of aorta: Secondary | ICD-10-CM | POA: Diagnosis not present

## 2022-05-18 LAB — COMPREHENSIVE METABOLIC PANEL
ALT: 34 U/L (ref 0–44)
AST: 43 U/L — ABNORMAL HIGH (ref 15–41)
Albumin: 3.4 g/dL — ABNORMAL LOW (ref 3.5–5.0)
Alkaline Phosphatase: 89 U/L (ref 38–126)
Anion gap: 12 (ref 5–15)
BUN: 9 mg/dL (ref 8–23)
CO2: 21 mmol/L — ABNORMAL LOW (ref 22–32)
Calcium: 8.4 mg/dL — ABNORMAL LOW (ref 8.9–10.3)
Chloride: 98 mmol/L (ref 98–111)
Creatinine, Ser: 0.53 mg/dL (ref 0.44–1.00)
GFR, Estimated: >60 mL/min (ref >60)
Glucose, Bld: 111 mg/dL — ABNORMAL HIGH (ref 70–99)
Potassium: 4.2 mmol/L (ref 3.5–5.1)
Sodium: 131 mmol/L — ABNORMAL LOW (ref 135–145)
Total Bilirubin: 1 mg/dL (ref 0.3–1.2)
Total Protein: 6.2 g/dL — ABNORMAL LOW (ref 6.5–8.1)

## 2022-05-18 LAB — CBC WITH DIFFERENTIAL/PLATELET
Abs Immature Granulocytes: 0.03 10*3/uL (ref 0.00–0.07)
Basophils Absolute: 0 10*3/uL (ref 0.0–0.1)
Basophils Relative: 1 %
Eosinophils Absolute: 0.1 10*3/uL (ref 0.0–0.5)
Eosinophils Relative: 1 %
HCT: 43.2 % (ref 36.0–46.0)
Hemoglobin: 14.9 g/dL (ref 12.0–15.0)
Immature Granulocytes: 0 %
Lymphocytes Relative: 6 %
Lymphs Abs: 0.4 10*3/uL — ABNORMAL LOW (ref 0.7–4.0)
MCH: 34.3 pg — ABNORMAL HIGH (ref 26.0–34.0)
MCHC: 34.5 g/dL (ref 30.0–36.0)
MCV: 99.5 fL (ref 80.0–100.0)
Monocytes Absolute: 0.9 10*3/uL (ref 0.1–1.0)
Monocytes Relative: 13 %
Neutro Abs: 5.6 10*3/uL (ref 1.7–7.7)
Neutrophils Relative %: 79 %
Platelets: 171 10*3/uL (ref 150–400)
RBC: 4.34 MIL/uL (ref 3.87–5.11)
RDW: 12.5 % (ref 11.5–15.5)
WBC: 7.1 10*3/uL (ref 4.0–10.5)
nRBC: 0 % (ref 0.0–0.2)

## 2022-05-18 LAB — PROTIME-INR
INR: 1.1 (ref 0.8–1.2)
Prothrombin Time: 13.7 seconds (ref 11.4–15.2)

## 2022-05-18 MED ORDER — GUAIFENESIN ER 600 MG PO TB12
600.0000 mg | ORAL_TABLET | Freq: Two times a day (BID) | ORAL | Status: DC
  Administered 2022-05-18 – 2022-05-20 (×4): 600 mg via ORAL
  Filled 2022-05-18 (×4): qty 1

## 2022-05-18 MED ORDER — ARFORMOTEROL TARTRATE 15 MCG/2ML IN NEBU
15.0000 ug | INHALATION_SOLUTION | Freq: Two times a day (BID) | RESPIRATORY_TRACT | Status: DC
  Administered 2022-05-18 – 2022-05-20 (×4): 15 ug via RESPIRATORY_TRACT
  Filled 2022-05-18 (×6): qty 2

## 2022-05-18 MED ORDER — METOPROLOL TARTRATE 12.5 MG HALF TABLET
12.5000 mg | ORAL_TABLET | Freq: Two times a day (BID) | ORAL | Status: DC
  Administered 2022-05-19: 12.5 mg via ORAL
  Filled 2022-05-18 (×2): qty 1

## 2022-05-18 MED ORDER — SODIUM CHLORIDE 0.9 % IV SOLN: INTRAVENOUS | Status: AC

## 2022-05-18 MED ORDER — DILTIAZEM HCL-DEXTROSE 125-5 MG/125ML-% IV SOLN (PREMIX)
5.0000 mg/h | INTRAVENOUS | Status: DC
  Administered 2022-05-18: 5 mg/h via INTRAVENOUS
  Filled 2022-05-18 (×3): qty 125

## 2022-05-18 MED ORDER — IPRATROPIUM-ALBUTEROL 0.5-2.5 (3) MG/3ML IN SOLN
3.0000 mL | Freq: Four times a day (QID) | RESPIRATORY_TRACT | Status: DC
  Administered 2022-05-18: 3 mL via RESPIRATORY_TRACT
  Filled 2022-05-18: qty 3

## 2022-05-18 MED ORDER — FLUTICASONE PROPIONATE 50 MCG/ACT NA SUSP: 1.0000 | NASAL | Status: DC | PRN

## 2022-05-18 MED ORDER — SODIUM CHLORIDE 3 % IN NEBU
4.0000 mL | INHALATION_SOLUTION | Freq: Two times a day (BID) | RESPIRATORY_TRACT | Status: DC
  Administered 2022-05-18 – 2022-05-20 (×3): 4 mL via RESPIRATORY_TRACT
  Filled 2022-05-18 (×5): qty 4

## 2022-05-18 MED ORDER — IOHEXOL 350 MG/ML SOLN
50.0000 mL | Freq: Once | INTRAVENOUS | Status: AC | PRN
  Administered 2022-05-18: 50 mL via INTRAVENOUS

## 2022-05-18 MED ORDER — DILTIAZEM LOAD VIA INFUSION
10.0000 mg | Freq: Once | INTRAVENOUS | Status: AC
  Administered 2022-05-18: 10 mg via INTRAVENOUS
  Filled 2022-05-18: qty 10

## 2022-05-18 MED ORDER — PANTOPRAZOLE SODIUM 40 MG PO TBEC
40.0000 mg | DELAYED_RELEASE_TABLET | Freq: Every day | ORAL | Status: DC
  Administered 2022-05-18 – 2022-05-20 (×3): 40 mg via ORAL
  Filled 2022-05-18 (×3): qty 1

## 2022-05-18 MED ORDER — ENSURE ENLIVE PO LIQD: 237.0000 mL | Freq: Two times a day (BID) | ORAL | Status: DC

## 2022-05-18 MED ORDER — DULOXETINE HCL 60 MG PO CPEP
60.0000 mg | ORAL_CAPSULE | Freq: Every day | ORAL | Status: DC
  Administered 2022-05-19 – 2022-05-20 (×2): 60 mg via ORAL
  Filled 2022-05-18 (×2): qty 2
  Filled 2022-05-18: qty 1

## 2022-05-18 MED ORDER — ACETAMINOPHEN 325 MG PO TABS
650.0000 mg | ORAL_TABLET | Freq: Four times a day (QID) | ORAL | Status: DC | PRN
  Administered 2022-05-19 – 2022-05-20 (×2): 650 mg via ORAL
  Filled 2022-05-18 (×2): qty 2

## 2022-05-18 MED ORDER — APIXABAN 5 MG PO TABS
5.0000 mg | ORAL_TABLET | Freq: Two times a day (BID) | ORAL | Status: DC
  Administered 2022-05-18 – 2022-05-20 (×4): 5 mg via ORAL
  Filled 2022-05-18 (×4): qty 1

## 2022-05-18 MED ORDER — UMECLIDINIUM BROMIDE 62.5 MCG/ACT IN AEPB
1.0000 | INHALATION_SPRAY | Freq: Every day | RESPIRATORY_TRACT | Status: DC
  Administered 2022-05-19 – 2022-05-20 (×2): 1 via RESPIRATORY_TRACT
  Filled 2022-05-18 (×2): qty 7

## 2022-05-18 MED ORDER — METOPROLOL TARTRATE 5 MG/5ML IV SOLN: 2.5000 mg | Freq: Four times a day (QID) | INTRAVENOUS | Status: DC | PRN

## 2022-05-18 MED ORDER — ONDANSETRON HCL 4 MG/2ML IJ SOLN
4.0000 mg | Freq: Once | INTRAMUSCULAR | Status: AC
  Administered 2022-05-18: 4 mg via INTRAVENOUS
  Filled 2022-05-18: qty 2

## 2022-05-18 MED ORDER — ENSURE ENLIVE PO LIQD
237.0000 mL | Freq: Two times a day (BID) | ORAL | Status: DC
  Administered 2022-05-19 – 2022-05-20 (×3): 237 mL via ORAL
  Filled 2022-05-18: qty 237

## 2022-05-18 MED ORDER — LACTATED RINGERS IV BOLUS
250.0000 mL | Freq: Once | INTRAVENOUS | Status: AC
  Administered 2022-05-18: 250 mL via INTRAVENOUS

## 2022-05-18 NOTE — Discharge Instructions (Addendum)
Amanda Sims,  You were in the hospital and found to have COVID-19 in addition to atrial fibrillation. You have been started on a blood thinner to reduce your risk for blood clots in addition to molnupiravir to help minimize symptoms from COVID-19 infection. Please follow-up with your PCP in addition to a cardiologist (appointment has been made).   Information on my medicine - ELIQUIS (apixaban)  This medication education was reviewed with me or my healthcare representative as part of my discharge preparation.    Why was Eliquis prescribed for you? Eliquis was prescribed for you to reduce the risk of a blood clot forming that can cause a stroke if you have a medical condition called atrial fibrillation (a type of irregular heartbeat).  What do You need to know about Eliquis ? Take your Eliquis TWICE DAILY - one tablet in the morning and one tablet in the evening with or without food. If you have difficulty swallowing the tablet whole please discuss with your pharmacist how to take the medication safely.  Take Eliquis exactly as prescribed by your doctor and DO NOT stop taking Eliquis without talking to the doctor who prescribed the medication.  Stopping may increase your risk of developing a stroke.  Refill your prescription before you run out.  After discharge, you should have regular check-up appointments with your healthcare provider that is prescribing your Eliquis.  In the future your dose may need to be changed if your kidney function or weight changes by a significant amount or as you get older.  What do you do if you miss a dose? If you miss a dose, take it as soon as you remember on the same day and resume taking twice daily.  Do not take more than one dose of ELIQUIS at the same time to make up a missed dose.  Important Safety Information A possible side effect of Eliquis is bleeding. You should call your healthcare provider right away if you experience any of the  following: Bleeding from an injury or your nose that does not stop. Unusual colored urine (red or dark brown) or unusual colored stools (red or black). Unusual bruising for unknown reasons. A serious fall or if you hit your head (even if there is no bleeding).  Some medicines may interact with Eliquis and might increase your risk of bleeding or clotting while on Eliquis. To help avoid this, consult your healthcare provider or pharmacist prior to using any new prescription or non-prescription medications, including herbals, vitamins, non-steroidal anti-inflammatory drugs (NSAIDs) and supplements.  This website has more information on Eliquis (apixaban): http://www.eliquis.com/eliquis/home

## 2022-05-18 NOTE — ED Notes (Signed)
Pt stated she was feeling nauseous notified EDP

## 2022-05-18 NOTE — ED Notes (Signed)
Pt ambulated to restroom with steady gait.

## 2022-05-18 NOTE — ED Provider Notes (Signed)
[facility] EMERGENCY DEPARTMENT Provider Note   CSN: [id number] Arrival date & time: 05/18/22  1302     History  Chief Complaint  Patient presents with   Atrial Fibrillation    DEVLYN PARISH is a 81 y.o. female.  HPI 81 year old female history of high blood pressure, MAIC, presents today complaining of generalized weakness.  She states that she felt very lightheaded beginning today.  She had a poor appetite and generalized weakness last night.  She complains of some headache and feeling there is crackling sensation in her head.  She denies any fever, neck pain, productive cough, nausea, vomiting, diarrhea, or UTI symptoms.  She reports taking her home medication as prescribed.    Home Medications Prior to Admission medications   Medication Sig Start Date End Date Taking? Authorizing Provider  acetaminophen (TYLENOL) 650 MG CR tablet Take 650 mg by mouth as needed for pain.    [provider]  amLODipine (NORVASC) 5 MG tablet Take 10 mg by mouth daily.     [provider]  cetirizine (ZYRTEC) 10 MG tablet Take 10 mg by mouth daily.    [provider]  DULoxetine (CYMBALTA) 60 MG capsule Take 1 capsule by mouth daily. 11/13/15   [provider]  fluticasone (FLONASE) 50 MCG/ACT nasal spray Place 1 spray into both nostrils as needed. 09/09/15   [provider]  lisinopril (PRINIVIL,ZESTRIL) 40 MG tablet Take 1 tablet by mouth daily. 11/12/15   [provider]  metoprolol (TOPROL-XL) 50 MG 24 hr tablet Take 50 mg by mouth daily.      [provider]  Multiple Vitamin (MULTIVITAMIN) tablet Take 1 tablet by mouth daily.      [provider]  omeprazole (PRILOSEC) 20 MG capsule Take 20 mg by mouth daily.      [provider]  Tiotropium Bromide-Olodaterol (STIOLTO RESPIMAT) 2.5-2.5 MCG/ACT AERS Inhale 2 puffs into the lungs daily. 11/05/21   Deneise Lever, MD      Allergies    Patient  has no known allergies.    Review of Systems   Review of Systems  Physical Exam Updated Vital Signs BP (!) 165/88   Pulse 95   Temp 98.4 F (36.9 C) (Oral)   Resp (!) 26   Ht 1.626 m ('5\' 4"'$ )   Wt 70.3 kg   SpO2 93%   BMI 26.61 kg/m  Physical Exam Vitals and nursing note reviewed.  Constitutional:      Appearance: Normal appearance.  HENT:     Head: Normocephalic.     Right Ear: External ear normal.     Left Ear: External ear normal.     Mouth/Throat:     Pharynx: Oropharynx is clear.  Eyes:     Pupils: Pupils are equal, round, and reactive to light.  Cardiovascular:     Rate and Rhythm: Normal rate. Rhythm irregular.     Pulses: Normal pulses.  Pulmonary:     Effort: Pulmonary effort is normal.     Breath sounds: Normal breath sounds.  Abdominal:     General: Abdomen is flat. Bowel sounds are normal.  Musculoskeletal:        General: Normal range of motion.     Cervical back: Normal range of motion.  Skin:    General: Skin is warm.     Capillary Refill: Capillary refill takes less than 2 seconds.  Neurological:     General: No focal deficit present.  Mental Status: She is alert.     Cranial Nerves: No cranial nerve deficit.     Sensory: No sensory deficit.     Motor: No weakness.     Coordination: Coordination normal.  Psychiatric:        Mood and Affect: Mood normal.     ED Results / Procedures / Treatments   Labs (all labs ordered are listed, but only abnormal results are displayed) Labs Reviewed  CBC WITH DIFFERENTIAL/PLATELET - Abnormal; Notable for the following components:      Result Value   MCH 34.3 (*)    Lymphs Abs 0.4 (*)    All other components within normal limits  COMPREHENSIVE METABOLIC PANEL - Abnormal; Notable for the following components:   Sodium 131 (*)    CO2 21 (*)    Glucose, Bld 111 (*)    Calcium 8.4 (*)    Total Protein 6.2 (*)    Albumin 3.4 (*)    AST 43 (*)    All other components within normal limits   PROTIME-INR  CBC WITH DIFFERENTIAL/PLATELET  CBC WITH DIFFERENTIAL/PLATELET    EKG EKG Interpretation  Date/Time:  Monday May 18 2022 13:18:21 EDT Ventricular Rate:  83 PR Interval:    QRS Duration: 72 QT Interval:  301 QTC Calculation: 354 R Axis:   83 Text Interpretation: Atrial fibrillation Borderline right axis deviation Borderline repolarization abnormality No old tracing to compare Confirmed by Pattricia Boss [phone]) on 05/18/2022 1:39:33 PM  Radiology DG Chest Port 1 View  Result Date: 05/18/2022 CLINICAL DATA:  Weakness, shortness of breath. EXAM: PORTABLE CHEST 1 VIEW COMPARISON:  October 20, 2021. FINDINGS: Stable cardiomegaly. Right paratracheal prominence is noted concerning for adenopathy or mass. Lungs are clear. Bony thorax is unremarkable. IMPRESSION: Right paratracheal prominence is concerning for adenopathy or mass. CT scan of the chest with intravenous contrast is recommended for further evaluation. Electronically Signed   By: Marijo Conception M.D.   On: 05/18/2022 14:40    Procedures Procedures    Medications Ordered in ED Medications  lactated ringers bolus 250 mL (0 mLs Intravenous Stopped 05/18/22 1439)    ED Course/ Medical Decision Making/ A&P Clinical Course as of 05/18/22 1648  Mon May 18, 2022  1637 CBC reviewed interpreted and within normal limits [DR]  4656 Pleat metabolic panel reviewed interpreted and significant for mild hypokalemia with a sodium of 131 with first prior reviewed and 136 Hypocalcemia noted [DR]  1645 Chest x-Tatyanna Cronk reviewed and no acute abnormality noted on my review, specifically no consolidation or pneumothorax and significant for right paratracheal prominence concerning for adenopathy or mass and the CT scan is advised [DR]    Clinical Course User Index [DR] Pattricia Boss, MD                           Medical Decision Making 81 year old female presents today complaining of generalized weakness.  On her exam she has no  focal deficits She is hypertensive here in the department.  EKG obtained shows atrial fib with controlled rate Differential diagnosis is extensive and includes but is not limited to Acute infection including COVID Metabolic abnormalities including hyponatremia, hypernatremia, and other electrolyte abnormalities Anemia Arrhythmias and cardiac etiologies Plan to obtain labs, imaging, EKG was obtained and shows a new onset of A-fib Patient will be maintained on monitor with IV access Patient with new onset atrial fibrillation here in emergency department.   Chest x-Bristyl Mclees with normality  and CT is recommended Care discussed with Dr. Vanita Panda who has accepted patient in signout  Amount and/or Complexity of Data Reviewed Labs: ordered. Decision-making details documented in ED Course. Radiology: ordered and independent interpretation performed. Decision-making details documented in ED Course.  Risk Decision regarding hospitalization.           Final Clinical Impression(s) / ED Diagnoses Final diagnoses:  New onset a-fib ([place])  Weakness  Abnormal CXR    Rx / DC Orders ED Discharge Orders     None         Pattricia Boss, MD 05/18/22 [phone]

## 2022-05-18 NOTE — ED Notes (Signed)
ED Provider at bedside. 

## 2022-05-18 NOTE — ED Triage Notes (Signed)
Pt bib ems from [place] c/o Afib undiagnosed. Pt states she hasn't felt well. She was dizzy, lightheaded, and mild sob. Pt states temperature was 101.1 F and took a Tylenol at 6:30 am.   BP 180/95 HR 80 RR 15 RA 95% CBG 133

## 2022-05-18 NOTE — ED Provider Notes (Signed)
8:52 PM Care of the patient assumed from Dr. Jeanell Sparrow at signout.  Now, the patient is coming by her daughter.  We discussed all findings including CT which was performed due to abnormal x-ray.  Patient has had episodes of increased work of breathing, and with CT suggesting mucous plugging, likely related to her MAI infection, new A-fib, patient started Cardizem drip, Eliquis, will be admitted for further monitoring, management.   Carmin Muskrat, MD 05/18/22 2053

## 2022-05-18 NOTE — H&P (Addendum)
History and Physical  Amanda Sims WCH:[id number] DOB: 1940/12/19 DOA: 05/18/2022  Referring physician: Dr. Vanita Panda, [place]  PCP: Marda Stalker, PA-C  Outpatient Specialists: None Patient coming from: ALF  Chief Complaint: Fatigue, cough   HPI: Amanda Sims is a 81 y.o. female with medical history significant for bronchiectasis, Mycobacterium Avium, COPD (not on O2 supplementation at baseline), history of hemoptysis, dyspnea on exertion, hypertension, who presented to [facility] ED with complaints of generalized fatigue and cough.  Associated with a subjective fever with Tmax of 101.1.  She took Tylenol this morning.  Endorses moving to ALF 3 weeks ago, states it may have had a tow on her overall.  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  She was noted to be in A-fib in the ED and was started on Cardizem drip for rate control, and Eliquis for CVA prevention.  EDP contacted cardiology.  The patient was admitted by [facility], hospitalist service.  ED Course: Afebrile, Tmax 98.4.  BP 155/87, pulse 96, respiratory rate 21, O2 saturation 97% on 2 L.  Lab studies markable for serum sodium 131, serum bicarb 21, glucose 111, albumin 3.4, AST 43.  Review of Systems: Review of systems as noted in the HPI. All other systems reviewed and are negative.   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 She has never used smokeless tobacco. She reports current alcohol use. She reports that she does not use drugs.   No Known Allergies  Family history: Unknown     Prior to Admission medications   Medication Sig Start Date End Date Taking? Authorizing Provider  acetaminophen  (TYLENOL) 650 MG CR tablet Take 650 mg by mouth as needed for pain.    [provider]  amLODipine (NORVASC) 5 MG tablet Take 10 mg by mouth daily.     [provider]  cetirizine (ZYRTEC) 10 MG tablet Take 10 mg by mouth daily.    [provider]  DULoxetine (CYMBALTA) 60 MG capsule Take 1 capsule by mouth daily. 11/13/15   [provider]  fluticasone (FLONASE) 50 MCG/ACT nasal spray Place 1 spray into both nostrils as needed. 09/09/15   [provider]  lisinopril (PRINIVIL,ZESTRIL) 40 MG tablet Take 1 tablet by mouth daily. 11/12/15   [provider]  metoprolol (TOPROL-XL) 50 MG 24 hr tablet Take 50 mg by mouth daily.      [provider]  Multiple Vitamin (MULTIVITAMIN) tablet Take 1 tablet by mouth daily.      [provider]  omeprazole (PRILOSEC) 20 MG capsule Take 20 mg by mouth daily.      [provider]  Tiotropium Bromide-Olodaterol (STIOLTO RESPIMAT) 2.5-2.5 MCG/ACT AERS Inhale 2 puffs into the lungs daily. 11/05/21   Deneise Lever, MD    Physical Exam: BP (!) 154/98   Pulse 97   Temp 98 F (36.7 C) (Oral)   Resp (!) 24   Ht '5\' 4"'$  (1.626 m)   Wt 70.3 kg   SpO2 (!) 88%   BMI 26.61 kg/m   General: 81 y.o. year-old female well developed well nourished in no acute distress.  Alert and  oriented x3. Cardiovascular: Regular rate and rhythm with no rubs or gallops.  No thyromegaly or JVD noted.  No lower extremity edema. 2/4 pulses in all 4 extremities. Respiratory: Diffuse rales bilaterally.  No wheezing noted.  Poor inspiratory effort. Abdomen: Soft nontender nondistended with normal bowel sounds x4 quadrants. Muskuloskeletal: No cyanosis, clubbing or edema noted bilaterally Neuro: CN II-XII intact, strength, sensation, reflexes Skin: No ulcerative lesions noted or rashes Psychiatry: Judgement and insight appear normal. Mood is appropriate for condition and setting          Labs on Admission:   Basic Metabolic Panel: Recent Labs  Lab 05/18/22 1549  NA 131*  K 4.2  CL 98  CO2 21*  GLUCOSE 111*  BUN 9  CREATININE 0.53  CALCIUM 8.4*   Liver Function Tests: Recent Labs  Lab 05/18/22 1549  AST 43*  ALT 34  ALKPHOS 89  BILITOT 1.0  PROT 6.2*  ALBUMIN 3.4*   No results for input(s): "LIPASE", "AMYLASE" in the last 168 hours. No results for input(s): "AMMONIA" in the last 168 hours. CBC: Recent Labs  Lab 05/18/22 1358  WBC 7.1  NEUTROABS 5.6  HGB 14.9  HCT 43.2  MCV 99.5  PLT 171   Cardiac Enzymes: No results for input(s): "CKTOTAL", "CKMB", "CKMBINDEX", "TROPONINI" in the last 168 hours.  BNP (last 3 results) No results for input(s): "BNP" in the last 8760 hours.  ProBNP (last 3 results) No results for input(s): "PROBNP" in the last 8760 hours.  CBG: No results for input(s): "GLUCAP" in the last 168 hours.  Radiological Exams on Admission: CT Chest W Contrast  Result Date: 05/18/2022 CLINICAL DATA:  Mild shortness of breath.  Lung nodule. EXAM: CT CHEST WITH CONTRAST TECHNIQUE: Multidetector CT imaging of the chest was performed during intravenous contrast administration. RADIATION DOSE REDUCTION: This exam was performed according to the departmental dose-optimization program which includes automated exposure control, adjustment of the mA and/or kV according to patient size and/or use of iterative reconstruction technique. CONTRAST:  50m OMNIPAQUE IOHEXOL 350 MG/ML SOLN COMPARISON:  Chest x-ray same day FINDINGS: Cardiovascular: The heart is moderately enlarged. Aorta is normal in size. There are atherosclerotic calcifications of the aorta. There is no pericardial effusion. Mediastinum/Nodes: No enlarged mediastinal, hilar, or axillary lymph nodes. Thyroid gland, trachea, and esophagus demonstrate no significant findings. Lungs/Pleura: There is scarring in both lung apices. There is some peripheral bronchiectasis and mucous plugging in the anterior right  upper lobe and right middle lobe. There is a 3 mm nodule in the right upper lobe image 5/64. Minimal emphysematous changes are present. There is atelectasis in the lingula. There is a calcified granuloma in the right lower lobe. No pleural effusion or pneumothorax. Trachea and central airways are patent. Upper Abdomen: No acute abnormality. Musculoskeletal: No chest wall abnormality. No acute or significant osseous findings. IMPRESSION: 1. Minimal bronchiectasis and peripheral mucous plugging in the right upper lobe and right middle lobe. 2. Atelectasis in the lingula. 3. 3 mm right solid pulmonary nodule within the upper lobe. Per Fleischner Society Guidelines, a non-contrast Chest CT at 12 months is optional. If performed and the nodule is stable at 12 months, no further follow-up is recommended. These guidelines do not apply to immunocompromised patients and patients with cancer. Follow up in patients with significant comorbidities as clinically warranted. For lung cancer screening, adhere to Lung-RADS guidelines. Reference: Radiology. 2017; 284(1):228-43. 4. Moderate cardiomegaly. Aortic Atherosclerosis (ICD10-I70.0) and Emphysema (ICD10-J43.9). Electronically Signed   By: ARonney Asters  M.D.   On: 05/18/2022 20:04   DG Chest Port 1 View  Result Date: 05/18/2022 CLINICAL DATA:  Weakness, shortness of breath. EXAM: PORTABLE CHEST 1 VIEW COMPARISON:  October 20, 2021. FINDINGS: Stable cardiomegaly. Right paratracheal prominence is noted concerning for adenopathy or mass. Lungs are clear. Bony thorax is unremarkable. IMPRESSION: Right paratracheal prominence is concerning for adenopathy or mass. CT scan of the chest with intravenous contrast is recommended for further evaluation. Electronically Signed   By: Marijo Conception M.D.   On: 05/18/2022 14:40    EKG: I independently viewed the EKG done and my findings are as followed: Atrial fibrillation rate of 95.  Nonspecific ST changes.  QTc 399.      Assessment/Plan Present on Admission:  New onset atrial fibrillation (HCC) A-fib rate of 95.  Nonspecific ST-T changes.  Change QTc 390 Principal Problem:   New onset atrial fibrillation (HCC)  New-onset atrial fibrillation Currently rate controlled with diltiazem drip, wean off as tolerated TSH, 2D echo Monitor on telemetry Add p.o. Lopressor 12.5 mg twice daily EDP contacted cardiology Continue Eliquis started in the ED for CVA prevention  Hypovolemic hyponatremia Serum sodium 131 Gentle IV fluid NS at 50 cc/h x 1 day, as tolerated. Repeat labs in the morning  Mild anion gap metabolic acidosis Serum bicarb 21, anion gap 12 Continue IV fluid Repeat chemistry panel in the morning.  Isolated elevated AST Monitor for now  Chronic MAI infection Resume home regimen Hypersaline nebs twice daily Mucinex as needed Incentive spirometer Flutter valve  Generalized fatigue, suspect multifactorial Treat underlying condition  UA pending IV fluid PT OT assessment Fall precautions   DVT prophylaxis: Subcu Lovenox daily  Code Status: Full code  Family Communication: None at bedside  Disposition Plan: Admitted to telemetry cardiac unit  Consults called: Cardiology consulted by EDP  Admission status: Observation status   Status is: Observation    Kayleen Memos MD [facility] Pager [phone]  If 7PM-7AM, please contact night-coverage [email or web address] Password [facility]  05/18/2022, 9:03 PM

## 2022-05-18 NOTE — ED Notes (Signed)
Patient placed on nasal cannula 2L due to oxygen levels going low to 88%. Patient also reported feeling out of breath at this time. No signs of respiratory distress noted.

## 2022-05-18 NOTE — ED Notes (Signed)
Patient transported to CT 

## 2022-05-19 ENCOUNTER — Other Ambulatory Visit (HOSPITAL_COMMUNITY): Payer: Self-pay

## 2022-05-19 ENCOUNTER — Observation Stay (HOSPITAL_COMMUNITY): Payer: Medicare Other

## 2022-05-19 ENCOUNTER — Telehealth (HOSPITAL_COMMUNITY): Payer: Self-pay

## 2022-05-19 DIAGNOSIS — Z79899 Other long term (current) drug therapy: Secondary | ICD-10-CM | POA: Diagnosis not present

## 2022-05-19 DIAGNOSIS — J479 Bronchiectasis, uncomplicated: Secondary | ICD-10-CM | POA: Diagnosis present

## 2022-05-19 DIAGNOSIS — I4891 Unspecified atrial fibrillation: Secondary | ICD-10-CM | POA: Diagnosis present

## 2022-05-19 DIAGNOSIS — R7401 Elevation of levels of liver transaminase levels: Secondary | ICD-10-CM | POA: Diagnosis present

## 2022-05-19 DIAGNOSIS — I700 Atherosclerosis of aorta: Secondary | ICD-10-CM | POA: Diagnosis not present

## 2022-05-19 DIAGNOSIS — E872 Acidosis, unspecified: Secondary | ICD-10-CM | POA: Diagnosis present

## 2022-05-19 DIAGNOSIS — E876 Hypokalemia: Secondary | ICD-10-CM | POA: Diagnosis present

## 2022-05-19 DIAGNOSIS — I10 Essential (primary) hypertension: Secondary | ICD-10-CM | POA: Diagnosis present

## 2022-05-19 DIAGNOSIS — M8580 Other specified disorders of bone density and structure, unspecified site: Secondary | ICD-10-CM | POA: Diagnosis present

## 2022-05-19 DIAGNOSIS — I071 Rheumatic tricuspid insufficiency: Secondary | ICD-10-CM | POA: Diagnosis present

## 2022-05-19 DIAGNOSIS — E871 Hypo-osmolality and hyponatremia: Secondary | ICD-10-CM | POA: Diagnosis present

## 2022-05-19 DIAGNOSIS — A310 Pulmonary mycobacterial infection: Secondary | ICD-10-CM | POA: Diagnosis present

## 2022-05-19 DIAGNOSIS — R5381 Other malaise: Secondary | ICD-10-CM | POA: Diagnosis present

## 2022-05-19 DIAGNOSIS — U071 COVID-19: Secondary | ICD-10-CM | POA: Diagnosis present

## 2022-05-19 DIAGNOSIS — E861 Hypovolemia: Secondary | ICD-10-CM | POA: Diagnosis present

## 2022-05-19 LAB — COMPREHENSIVE METABOLIC PANEL
ALT: 36 U/L (ref 0–44)
AST: 36 U/L (ref 15–41)
Albumin: 3.4 g/dL — ABNORMAL LOW (ref 3.5–5.0)
Alkaline Phosphatase: 81 U/L (ref 38–126)
Anion gap: 9 (ref 5–15)
BUN: 6 mg/dL — ABNORMAL LOW (ref 8–23)
CO2: 25 mmol/L (ref 22–32)
Calcium: 8.7 mg/dL — ABNORMAL LOW (ref 8.9–10.3)
Chloride: 96 mmol/L — ABNORMAL LOW (ref 98–111)
Creatinine, Ser: 0.52 mg/dL (ref 0.44–1.00)
GFR, Estimated: >60 mL/min (ref >60)
Glucose, Bld: 109 mg/dL — ABNORMAL HIGH (ref 70–99)
Potassium: 3.1 mmol/L — ABNORMAL LOW (ref 3.5–5.1)
Sodium: 130 mmol/L — ABNORMAL LOW (ref 135–145)
Total Bilirubin: 1 mg/dL (ref 0.3–1.2)
Total Protein: 6.6 g/dL (ref 6.5–8.1)

## 2022-05-19 LAB — ECHOCARDIOGRAM COMPLETE
Area-P 1/2: 6.96 cm2
Height: 64 in
S' Lateral: 3.3 cm
Weight: 2529.6 oz

## 2022-05-19 LAB — CBC
HCT: 41.4 % (ref 36.0–46.0)
Hemoglobin: 13.7 g/dL (ref 12.0–15.0)
MCH: 33.3 pg (ref 26.0–34.0)
MCHC: 33.1 g/dL (ref 30.0–36.0)
MCV: 100.7 fL — ABNORMAL HIGH (ref 80.0–100.0)
Platelets: 125 10*3/uL — ABNORMAL LOW (ref 150–400)
RBC: 4.11 MIL/uL (ref 3.87–5.11)
RDW: 12.4 % (ref 11.5–15.5)
WBC: 5.7 10*3/uL (ref 4.0–10.5)
nRBC: 0 % (ref 0.0–0.2)

## 2022-05-19 LAB — D-DIMER, QUANTITATIVE: D-Dimer, Quant: 0.49 ug/mL-FEU (ref 0.00–0.50)

## 2022-05-19 LAB — URINALYSIS, ROUTINE W REFLEX MICROSCOPIC
Bilirubin Urine: NEGATIVE
Glucose, UA: NEGATIVE mg/dL
Ketones, ur: NEGATIVE mg/dL
Leukocytes,Ua: NEGATIVE
Nitrite: NEGATIVE
Protein, ur: NEGATIVE mg/dL
Specific Gravity, Urine: 1.015 (ref 1.005–1.030)
pH: 6 (ref 5.0–8.0)

## 2022-05-19 LAB — PHOSPHORUS: Phosphorus: 2.9 mg/dL (ref 2.5–4.6)

## 2022-05-19 LAB — SARS CORONAVIRUS 2 BY RT PCR: SARS Coronavirus 2 by RT PCR: POSITIVE — AB

## 2022-05-19 LAB — TSH: TSH: 0.999 u[IU]/mL (ref 0.350–4.500)

## 2022-05-19 LAB — MAGNESIUM: Magnesium: 1.9 mg/dL (ref 1.7–2.4)

## 2022-05-19 LAB — URINALYSIS, MICROSCOPIC (REFLEX)

## 2022-05-19 MED ORDER — POTASSIUM CHLORIDE CRYS ER 20 MEQ PO TBCR
40.0000 meq | EXTENDED_RELEASE_TABLET | ORAL | Status: AC
  Administered 2022-05-19 (×2): 40 meq via ORAL
  Filled 2022-05-19 (×2): qty 2

## 2022-05-19 MED ORDER — IPRATROPIUM-ALBUTEROL 0.5-2.5 (3) MG/3ML IN SOLN
3.0000 mL | Freq: Two times a day (BID) | RESPIRATORY_TRACT | Status: DC
  Administered 2022-05-20: 3 mL via RESPIRATORY_TRACT
  Filled 2022-05-19: qty 3

## 2022-05-19 MED ORDER — IPRATROPIUM-ALBUTEROL 0.5-2.5 (3) MG/3ML IN SOLN
3.0000 mL | Freq: Three times a day (TID) | RESPIRATORY_TRACT | Status: DC
  Administered 2022-05-19: 3 mL via RESPIRATORY_TRACT
  Filled 2022-05-19: qty 3

## 2022-05-19 MED ORDER — METOPROLOL TARTRATE 5 MG/5ML IV SOLN: 2.5000 mg | INTRAVENOUS | Status: DC | PRN

## 2022-05-19 MED ORDER — METOPROLOL TARTRATE 25 MG PO TABS
25.0000 mg | ORAL_TABLET | Freq: Two times a day (BID) | ORAL | Status: DC
  Administered 2022-05-19 – 2022-05-20 (×2): 25 mg via ORAL
  Filled 2022-05-19 (×2): qty 1

## 2022-05-19 MED ORDER — POLYETHYLENE GLYCOL 3350 17 G PO PACK
17.0000 g | PACK | Freq: Every day | ORAL | Status: DC
  Administered 2022-05-19 – 2022-05-20 (×2): 17 g via ORAL
  Filled 2022-05-19 (×2): qty 1

## 2022-05-19 NOTE — Evaluation (Signed)
Physical Therapy Evaluation Patient Details Name: Amanda Sims MRN: [id number] DOB: [birth year] Today's Date: 05/19/2022  History of Present Illness  Amanda Sims is a 81 y.o. female admittted 9/25 who presented to [facility] ED with complaints of generalized fatigue and cough.  Associated with a subjective fever with Tmax of 101.1. Pt with afib and ? mucus plug. PMH:  bronchiectasis, Mycobacterium Avium, COPD (not on O2 supplementation at baseline), history of hemoptysis, dyspnea on exertion, hypertension  Clinical Impression  Pt admitted with above diagnosis. Pt was able to ambulate with min assist due to 1 LOB needing assist.  Recommend rollator for home and HHPT.  Will follow acutely.  Pt currently with functional limitations due to the deficits listed below (see PT Problem List). Pt will benefit from skilled PT to increase their independence and safety with mobility to allow discharge to the venue listed below.          Recommendations for follow up therapy are one component of a multi-disciplinary discharge planning process, led by the attending physician.  Recommendations may be updated based on patient status, additional functional criteria and insurance authorization.  Follow Up Recommendations Home health PT      Assistance Recommended at Discharge Intermittent Supervision/Assistance  Patient can return home with the following  A little help with walking and/or transfers;A little help with bathing/dressing/bathroom;Help with stairs or ramp for entrance;Assistance with cooking/housework    Programmer, applications (4 wheels)  Recommendations for Other Services       Functional Status Assessment Patient has had a recent decline in their functional status and demonstrates the ability to make significant improvements in function in a reasonable and predictable amount of time.     Precautions / Restrictions Precautions Precautions: Fall Restrictions Weight Bearing  Restrictions: No      Mobility  Bed Mobility Overal bed mobility: Independent                  Transfers Overall transfer level: Needs assistance Equipment used: None Transfers: Sit to/from Stand Sit to Stand: Min guard           General transfer comment: No assist needed    Ambulation/Gait Ambulation/Gait assistance: Min guard, Min assist Gait Distance (Feet): 60 Feet Assistive device: None, 1 person hand held assist Gait Pattern/deviations: Step-through pattern, Decreased stride length, Staggering left, Staggering right   Gait velocity interpretation: <1.31 ft/sec, indicative of household ambulator   General Gait Details: Pt was able to ambulate a short distance with pt having 1 LOB needing min assist. Pt unsure why she lost balance but was not challenged.  Stairs            Wheelchair Mobility    Modified Rankin (Stroke Patients Only)       Balance Overall balance assessment: Needs assistance Sitting-balance support: No upper extremity supported, Feet supported Sitting balance-Leahy Scale: Good     Standing balance support: No upper extremity supported, During functional activity, Single extremity supported Standing balance-Leahy Scale: Poor Standing balance comment: relies on 1 UE support for balance                             Pertinent Vitals/Pain Pain Assessment Pain Assessment: No/denies pain    Home Living Family/patient expects to be discharged to:: Private residence Living Arrangements: Spouse/significant other Available Help at Discharge: Family;Available PRN/intermittently (husband is dialysis pt) Type of Home: Independent living facility [facility], [place]) Home Access: Level entry;Elevator  Home Layout: One level Home Equipment: Grab bars - toilet;Grab bars - tub/shower Additional Comments: can use husbands walk in shower    Prior Function Prior Level of Function : Independent/Modified  Independent;Driving                     Hand Dominance   Dominant Hand: Right    Extremity/Trunk Assessment   Upper Extremity Assessment Upper Extremity Assessment: Defer to OT evaluation    Lower Extremity Assessment Lower Extremity Assessment: Overall WFL for tasks assessed    Cervical / Trunk Assessment Cervical / Trunk Assessment: Normal  Communication   Communication: No difficulties  Cognition Arousal/Alertness: Awake/alert Behavior During Therapy: WFL for tasks assessed/performed Overall Cognitive Status: Within Functional Limits for tasks assessed                                          General Comments General comments (skin integrity, edema, etc.): 77 bpm, 97% 2L,    Exercises     Assessment/Plan    PT Assessment Patient needs continued PT services  PT Problem List Decreased activity tolerance;Decreased balance;Decreased mobility;Decreased knowledge of use of DME;Decreased safety awareness;Decreased knowledge of precautions;Cardiopulmonary status limiting activity       PT Treatment Interventions DME instruction;Gait training;Functional mobility training;Therapeutic activities;Therapeutic exercise;Balance training;Patient/family education    PT Goals (Current goals can be found in the Care Plan section)  Acute Rehab PT Goals Patient Stated Goal: to go home PT Goal Formulation: With patient Time For Goal Achievement: 06/02/22    Frequency Min 3X/week     Co-evaluation               AM-PAC PT "6 Clicks" Mobility  Outcome Measure Help needed turning from your back to your side while in a flat bed without using bedrails?: A Little Help needed moving from lying on your back to sitting on the side of a flat bed without using bedrails?: A Little Help needed moving to and from a bed to a chair (including a wheelchair)?: A Little Help needed standing up from a chair using your arms (e.g., wheelchair or bedside chair)?: A  Little Help needed to walk in hospital room?: A Little Help needed climbing 3-5 steps with a railing? : A Lot 6 Click Score: 17    End of Session Equipment Utilized During Treatment: Gait belt;Oxygen Activity Tolerance: Patient limited by fatigue Patient left: with call bell/phone within reach (on stretcher) Nurse Communication: Mobility status PT Visit Diagnosis: Unsteadiness on feet (R26.81)    Time: 4356-8616 PT Time Calculation (min) (ACUTE ONLY): 25 min   Charges:   PT Evaluation $PT Eval Moderate Complexity: 1 Mod PT Treatments $Gait Training: 8-22 mins        [facility] M,PT Acute Rehab Services [place] 05/19/2022, 9:11 AM

## 2022-05-19 NOTE — Progress Notes (Addendum)
PROGRESS NOTE    Amanda Sims  UJW:[id number] DOB: [birth year] DOA: 05/18/2022 PCP: Amanda Stalker, PA-C  Chief Complaint  Patient presents with   Atrial Fibrillation    Brief Narrative:  Amanda Sims is Amanda Sims 81 y.o. female with medical history significant for bronchiectasis, Mycobacterium Avium, COPD (not on O2 supplementation at baseline), history of hemoptysis, dyspnea on exertion, hypertension, who presented to [facility] ED with complaints of generalized fatigue and cough.  Associated with Amanda Sims fever with Tmax of 101.1.  She took Tylenol this morning.   She was admitted for new afib with RVR and has now been found to have Amanda Sims COVID 19 infection.  See below for additional details   Assessment & Plan:   Principal Problem:   New onset atrial fibrillation (Amanda Sims) Active Problems:   Atrial fibrillation ([place])   Assessment and Plan: New-onset atrial fibrillation TSH wnl, 2D echo pending. Suspect related to her covid 19 infection, will follow d dimer as well. Currently rate controlled with diltiazem drip, wean off as tolerated Monitor on telemetry Add p.o. Lopressor 12.5 mg twice daily, with titrate as needed EDP contacted cardiology (not clear to me they were actually consulted, will hold off for now given controlled rate) Continue Eliquis started in the ED for CVA prevention Chadvasc at least 4 (follow echo)   COVID 19 virus infection Fever, malaise Will discuss potential treatment with her -> she's feeling Amanda Sims little better now, would like to hold off for now, can reevaluate in AM She's not hypoxic  COVID-19 Labs  No results for input(s): "DDIMER", "FERRITIN", "LDH", "CRP" in the last 72 hours.  Lab Results  Component Value Date   SARSCOV2NAA POSITIVE (Amanda Sims) 05/19/2022   Hypovolemic hyponatremia Mild, will continue to monitor    Mild anion gap metabolic acidosis improved   Hypokalemia Replace and follow  Isolated elevated AST wnl   Chronic MAI infection Hypersaline  nebs twice daily Mucinex as needed Incentive spirometer Flutter valve Follow outpatient with pulm   Generalized fatigue, suspect multifactorial Due to covid 19, afib with RVR Treat underlying condition  UA pending IV fluid PT OT assessment Fall precautions     DVT prophylaxis: eliquis Code Status: full Family Communication: none Disposition:   Status is: Inpatient Remains inpatient appropriate because: new afib, covid infection   Consultants:  none  Procedures:  none  Antimicrobials:  Anti-infectives (From admission, onward)    None       Subjective: Feels tired   Objective: Vitals:   05/19/22 1100 05/19/22 1200 05/19/22 1212 05/19/22 1300  BP: (!) 140/91 129/68  (!) 141/77  Pulse: 77 76  73  Resp: '18 18  18  '$ Temp:   98.6 F (37 C)   TempSrc:   Oral   SpO2: 97% 97%  98%  Weight:      Height:        Intake/Output Summary (Last 24 hours) at 05/19/2022 1421 Last data filed at 05/19/2022 1314 Gross per 24 hour  Intake 656.77 ml  Output --  Net 656.77 ml   Filed Weights   05/18/22 1314  Weight: 70.3 kg    Examination:  General exam: Appears calm and comfortable  Respiratory system: unlabored Cardiovascular system: irregularly irregular, improved rate Gastrointestinal system: Abdomen is nondistended, soft and nontender.  Central nervous system: Alert and oriented. No focal neurological deficits. Extremities: no LEE   Data Reviewed: I have personally reviewed following labs and imaging studies  CBC: Recent Labs  Lab 05/18/22 1358 05/19/22 0328  WBC 7.1 5.7  NEUTROABS 5.6  --   HGB 14.9 13.7  HCT 43.2 41.4  MCV 99.5 100.7*  PLT 171 125*    Basic Metabolic Panel: Recent Labs  Lab 05/18/22 1549 05/19/22 0328  NA 131* 130*  K 4.2 3.1*  CL 98 96*  CO2 21* 25  GLUCOSE 111* 109*  BUN 9 6*  CREATININE 0.53 0.52  CALCIUM 8.4* 8.7*  MG  --  1.9  PHOS  --  2.9    GFR: Estimated Creatinine Clearance: 54.8 mL/min (by C-G formula  based on SCr of 0.52 mg/dL).  Liver Function Tests: Recent Labs  Lab 05/18/22 1549 05/19/22 0328  AST 43* 36  ALT 34 36  ALKPHOS 89 81  BILITOT 1.0 1.0  PROT 6.2* 6.6  ALBUMIN 3.4* 3.4*    CBG: No results for input(s): "GLUCAP" in the last 168 hours.   Recent Results (from the past 240 hour(s))  SARS Coronavirus 2 by RT PCR (Sims order, performed in Amanda Sims [facility] lab) *cepheid single result test* Anterior Nasal Swab     Status: Abnormal   Collection Time: 05/19/22  8:11 AM   Specimen: Anterior Nasal Swab  Result Value Ref Range Status   SARS Coronavirus 2 by RT PCR POSITIVE (Amanda Sims) NEGATIVE Final    Comment: (NOTE) SARS-CoV-2 target nucleic acids are DETECTED  SARS-CoV-2 RNA is generally detectable in upper respiratory specimens  during the acute phase of infection.  Positive results are indicative  of the presence of the identified virus, but do not rule out bacterial infection or co-infection with other pathogens not detected by the test.  Clinical correlation with patient history and  other diagnostic information is necessary to determine patient infection status.  The expected result is negative.  Fact Sheet for Patients:   https://www.patel.info/   Fact Sheet for Healthcare Providers:   https://hall.com/    This test is not yet approved or cleared by the Amanda Sims and  has been authorized for detection and/or diagnosis of SARS-CoV-2 by [facility] under an Emergency Use Authorization (EUA).  This EUA will remain in effect (meaning this test can be used) for the duration of  the COVID-19 declaration under Section 564(b)(1)  of the Act, 21 U.S.C. section 360-bbb-3(b)(1), unless the authorization is terminated or revoked sooner.   Performed at [facility], [place] [address] [place], [place] [address]          Radiology Studies: CT Chest W Contrast  Result Date: 05/18/2022 CLINICAL DATA:  Mild  shortness of breath.  Lung nodule. EXAM: CT CHEST WITH CONTRAST TECHNIQUE: Multidetector CT imaging of the chest was performed during intravenous contrast administration. RADIATION DOSE REDUCTION: This exam was performed according to the departmental dose-optimization program which includes automated exposure control, adjustment of the mA and/or kV according to patient size and/or use of iterative reconstruction technique. CONTRAST:  54m OMNIPAQUE IOHEXOL 350 MG/ML SOLN COMPARISON:  Chest x-ray same day FINDINGS: Cardiovascular: The heart is moderately enlarged. Aorta is normal in size. There are atherosclerotic calcifications of the aorta. There is no pericardial effusion. Mediastinum/Nodes: No enlarged mediastinal, hilar, or axillary lymph nodes. Thyroid gland, trachea, and esophagus demonstrate no significant findings. Lungs/Pleura: There is scarring in both lung apices. There is some peripheral bronchiectasis and mucous plugging in the anterior right upper lobe and right middle lobe. There is Tesneem Dufrane 3 mm nodule in the right upper lobe image 5/64. Minimal emphysematous changes are present. There is atelectasis in the lingula. There  is Letticia Bhattacharyya calcified granuloma in the right lower lobe. No pleural effusion or pneumothorax. Trachea and central airways are patent. Upper Abdomen: No acute abnormality. Musculoskeletal: No chest wall abnormality. No acute or significant osseous findings. IMPRESSION: 1. Minimal bronchiectasis and peripheral mucous plugging in the right upper lobe and right middle lobe. 2. Atelectasis in the lingula. 3. 3 mm right solid pulmonary nodule within the upper lobe. Per Fleischner Society Guidelines, Yeraldin Litzenberger non-contrast Chest CT at 12 months is optional. If performed and the nodule is stable at 12 months, no further follow-up is recommended. These guidelines do not apply to immunocompromised patients and patients with cancer. Follow up in patients with significant comorbidities as clinically warranted. For  lung cancer screening, adhere to Lung-RADS guidelines. Reference: Radiology. 2017; 284(1):228-43. 4. Moderate cardiomegaly. Aortic Atherosclerosis (ICD10-I70.0) and Emphysema (ICD10-J43.9). Electronically Signed   By: Ronney Asters M.D.   On: 05/18/2022 20:04   DG Chest Port 1 View  Result Date: 05/18/2022 CLINICAL DATA:  Weakness, shortness of breath. EXAM: PORTABLE CHEST 1 VIEW COMPARISON:  October 20, 2021. FINDINGS: Stable cardiomegaly. Right paratracheal prominence is noted concerning for adenopathy or mass. Lungs are clear. Bony thorax is unremarkable. IMPRESSION: Right paratracheal prominence is concerning for adenopathy or mass. CT scan of the chest with intravenous contrast is recommended for further evaluation. Electronically Signed   By: Marijo Conception M.D.   On: 05/18/2022 14:40        Scheduled Meds:  apixaban  5 mg Oral BID   arformoterol  15 mcg Nebulization BID   And   umeclidinium bromide  1 puff Inhalation Daily   DULoxetine  60 mg Oral Daily   feeding supplement  237 mL Oral BID BM   guaiFENesin  600 mg Oral BID   ipratropium-albuterol  3 mL Nebulization TID   metoprolol tartrate  12.5 mg Oral BID   pantoprazole  40 mg Oral Daily   potassium chloride  40 mEq Oral Q4H   sodium chloride HYPERTONIC  4 mL Nebulization BID   Continuous Infusions:  sodium chloride 50 mL/hr at 05/19/22 1314   diltiazem (CARDIZEM) infusion 5 mg/hr (05/19/22 0552)     LOS: 0 days    Time spent: over 30 min    Fayrene Helper, MD [facility]   To contact the attending provider between 7A-7P or the covering provider during after hours 7P-7A, please log into the web site [email or web address] and access using universal [place] password for that web site. If you do not have the password, please call the [facility] operator.  05/19/2022, 2:21 PM

## 2022-05-19 NOTE — ED Notes (Signed)
Pt ambulated to bathroom with assistance by this RN 

## 2022-05-19 NOTE — TOC Benefit Eligibility Note (Signed)
Patient Teacher, English as a foreign language completed.    The current 30 day co-pay is, $47.00.   The patient is insured through [place], [place] Patient Advocate Specialist [place] Patient Advocate Team Direct Number: [phone]  Fax: [phone]

## 2022-05-19 NOTE — ED Notes (Signed)
ED TO INPATIENT HANDOFF REPORT  ED Nurse Name and Phone #: Justice Rocher [phone]  S Name/Age/Gender Amanda Sims 81 y.o. female Room/Bed: 005C/005C  Code Status   Code Status: Full Code  Home/SNF/Other Home Patient oriented to: self, place, time, and situation Is this baseline? Yes   Triage Complete: Triage complete  Chief Complaint New onset atrial fibrillation [facility]) [I48.91]  Triage Note Pt bib ems from [place] c/o Afib undiagnosed. Pt states she hasn't felt well. She was dizzy, lightheaded, and mild sob. Pt states temperature was 101.1 F and took a Tylenol at 6:30 am.   BP 180/95 HR 80 RR 15 RA 95% CBG 133   Allergies No Known Allergies  Level of Care/Admitting Diagnosis ED Disposition     ED Disposition  Admit   Condition  --   Comment  Hospital Area: [facility] [[id number]]  Level of Care: Telemetry Cardiac [[id number]]  May place patient in observation at [facility] or [facility] if equivalent level of care is available:: Yes  Covid Evaluation: Asymptomatic - no recent exposure (last 10 days) testing not required  Diagnosis: New onset atrial fibrillation [facility]) [[id number]]  Admitting Physician: Kayleen Memos [[id number]]  Attending Physician: Kayleen Memos [[id number]]          B Medical/Surgery History Past Medical History:  Diagnosis Date   Bronchiectasis    Hypertension    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[place])    Osteopenia    Past Surgical History:  Procedure Laterality Date   dental surgeries  1990's   VESICOVAGINAL FISTULA CLOSURE W/ TAH  1998     A IV Location/Drains/Wounds Patient Lines/Drains/Airways Status     Active Line/Drains/Airways     Name Placement date Placement time Site Days   Peripheral IV 05/18/22 18 G Anterior;Left;Proximal Forearm 05/18/22  1319  Forearm  1            Intake/Output Last 24 hours  Intake/Output Summary (Last 24 hours) at 05/19/2022 1213 Last data filed at 05/18/2022  1439 Gross per 24 hour  Intake 250 ml  Output --  Net 250 ml    Labs/Imaging Results for orders placed or performed during the hospital encounter of 05/18/22 (from the past 48 hour(s))  CBC with Differential     Status: Abnormal   Collection Time: 05/18/22  1:58 PM  Result Value Ref Range   WBC 7.1 4.0 - 10.5 K/uL   RBC 4.34 3.87 - 5.11 MIL/uL   Hemoglobin 14.9 12.0 - 15.0 g/dL   HCT 43.2 36.0 - 46.0 %   MCV 99.5 80.0 - 100.0 fL   MCH 34.3 (H) 26.0 - 34.0 pg   MCHC 34.5 30.0 - 36.0 g/dL   RDW 12.5 11.5 - 15.5 %   Platelets 171 150 - 400 K/uL   nRBC 0.0 0.0 - 0.2 %   Neutrophils Relative % 79 %   Neutro Abs 5.6 1.7 - 7.7 K/uL   Lymphocytes Relative 6 %   Lymphs Abs 0.4 (L) 0.7 - 4.0 K/uL   Monocytes Relative 13 %   Monocytes Absolute 0.9 0.1 - 1.0 K/uL   Eosinophils Relative 1 %   Eosinophils Absolute 0.1 0.0 - 0.5 K/uL   Basophils Relative 1 %   Basophils Absolute 0.0 0.0 - 0.1 K/uL   Immature Granulocytes 0 %   Abs Immature Granulocytes 0.03 0.00 - 0.07 K/uL    Comment: Performed at [facility], [address], [place], [place] [address]  Comprehensive metabolic panel     Status: Abnormal   Collection Time: 05/18/22  3:49 PM  Result Value Ref Range   Sodium 131 (L) 135 - 145 mmol/L   Potassium 4.2 3.5 - 5.1 mmol/L    Comment: HEMOLYSIS AT THIS LEVEL MAY AFFECT RESULT   Chloride 98 98 - 111 mmol/L   CO2 21 (L) 22 - 32 mmol/L   Glucose, Bld 111 (H) 70 - 99 mg/dL    Comment: Glucose reference range applies only to samples taken after fasting for at least 8 hours.   BUN 9 8 - 23 mg/dL   Creatinine, Ser 0.53 0.44 - 1.00 mg/dL   Calcium 8.4 (L) 8.9 - 10.3 mg/dL   Total Protein 6.2 (L) 6.5 - 8.1 g/dL   Albumin 3.4 (L) 3.5 - 5.0 g/dL   AST 43 (H) 15 - 41 U/L    Comment: HEMOLYSIS AT THIS LEVEL MAY AFFECT RESULT   ALT 34 0 - 44 U/L    Comment: HEMOLYSIS AT THIS LEVEL MAY AFFECT RESULT   Alkaline Phosphatase 89 38 - 126 U/L   Total Bilirubin 1.0 0.3 - 1.2 mg/dL     Comment: HEMOLYSIS AT THIS LEVEL MAY AFFECT RESULT   GFR, Estimated >60 >60 mL/min    Comment: (NOTE) Calculated using the CKD-EPI Creatinine Equation (2021)    Anion gap 12 5 - 15    Comment: Performed at [facility], [place] [address], [place], [place] [address]  Protime-INR     Status: None   Collection Time: 05/18/22  3:49 PM  Result Value Ref Range   Prothrombin Time 13.7 11.4 - 15.2 seconds   INR 1.1 0.8 - 1.2    Comment: (NOTE) INR goal varies based on device and disease states. Performed at [facility], [place] [address], [place], [place] [address]   CBC     Status: Abnormal   Collection Time: 05/19/22  3:28 AM  Result Value Ref Range   WBC 5.7 4.0 - 10.5 K/uL   RBC 4.11 3.87 - 5.11 MIL/uL   Hemoglobin 13.7 12.0 - 15.0 g/dL   HCT 41.4 36.0 - 46.0 %   MCV 100.7 (H) 80.0 - 100.0 fL   MCH 33.3 26.0 - 34.0 pg   MCHC 33.1 30.0 - 36.0 g/dL   RDW 12.4 11.5 - 15.5 %   Platelets 125 (L) 150 - 400 K/uL    Comment: REPEATED TO VERIFY   nRBC 0.0 0.0 - 0.2 %    Comment: Performed at [facility], [place] [address] [place], [place] [address]  Comprehensive metabolic panel     Status: Abnormal   Collection Time: 05/19/22  3:28 AM  Result Value Ref Range   Sodium 130 (L) 135 - 145 mmol/L   Potassium 3.1 (L) 3.5 - 5.1 mmol/L   Chloride 96 (L) 98 - 111 mmol/L   CO2 25 22 - 32 mmol/L   Glucose, Bld 109 (H) 70 - 99 mg/dL    Comment: Glucose reference range applies only to samples taken after fasting for at least 8 hours.   BUN 6 (L) 8 - 23 mg/dL   Creatinine, Ser 0.52 0.44 - 1.00 mg/dL   Calcium 8.7 (L) 8.9 - 10.3 mg/dL   Total Protein 6.6 6.5 - 8.1 g/dL   Albumin 3.4 (L) 3.5 - 5.0 g/dL   AST 36 15 - 41 U/L   ALT 36 0 - 44 U/L   Alkaline Phosphatase 81  38 - 126 U/L   Total Bilirubin 1.0 0.3 - 1.2 mg/dL   GFR, Estimated >60 >60 mL/min    Comment: (NOTE) Calculated using the CKD-EPI Creatinine Equation (2021)    Anion gap 9 5 - 15    Comment: Performed at [facility] [address], [place], [place] [address]  Magnesium     Status: None   Collection Time: 05/19/22  3:28 AM  Result Value Ref Range   Magnesium 1.9 1.7 - 2.4 mg/dL    Comment: Performed at [facility] [address], [place], [place] [address]  Phosphorus     Status: None   Collection Time: 05/19/22  3:28 AM  Result Value Ref Range   Phosphorus 2.9 2.5 - 4.6 mg/dL    Comment: Performed at [facility] [address], [place], [place] [address]  TSH     Status: None   Collection Time: 05/19/22  3:28 AM  Result Value Ref Range   TSH 0.999 0.350 - 4.500 uIU/mL    Comment: Performed by a 3rd Generation assay with a functional sensitivity of <=0.01 uIU/mL. Performed at [facility], [place] [address] [place], [place] [address]    CT Chest W Contrast  Result Date: 05/18/2022 CLINICAL DATA:  Mild shortness of breath.  Lung nodule. EXAM: CT CHEST WITH CONTRAST TECHNIQUE: Multidetector CT imaging of the chest was performed during intravenous contrast administration. RADIATION DOSE REDUCTION: This exam was performed according to the departmental dose-optimization program which includes automated exposure control, adjustment of the mA and/or kV according to patient size and/or use of iterative reconstruction technique. CONTRAST:  17m OMNIPAQUE IOHEXOL 350 MG/ML SOLN COMPARISON:  Chest x-ray same day FINDINGS: Cardiovascular: The heart is moderately enlarged. Aorta is normal in size. There are atherosclerotic calcifications of the aorta. There is no pericardial effusion. Mediastinum/Nodes: No enlarged mediastinal, hilar, or axillary lymph nodes. Thyroid gland, trachea, and esophagus demonstrate no significant findings. Lungs/Pleura: There is scarring in both lung apices. There is some peripheral bronchiectasis and mucous plugging in the anterior right upper lobe and right middle lobe. There is a 3 mm nodule in the right upper lobe image 5/64. Minimal emphysematous changes  are present. There is atelectasis in the lingula. There is a calcified granuloma in the right lower lobe. No pleural effusion or pneumothorax. Trachea and central airways are patent. Upper Abdomen: No acute abnormality. Musculoskeletal: No chest wall abnormality. No acute or significant osseous findings. IMPRESSION: 1. Minimal bronchiectasis and peripheral mucous plugging in the right upper lobe and right middle lobe. 2. Atelectasis in the lingula. 3. 3 mm right solid pulmonary nodule within the upper lobe. Per Fleischner Society Guidelines, a non-contrast Chest CT at 12 months is optional. If performed and the nodule is stable at 12 months, no further follow-up is recommended. These guidelines do not apply to immunocompromised patients and patients with cancer. Follow up in patients with significant comorbidities as clinically warranted. For lung cancer screening, adhere to Lung-RADS guidelines. Reference: Radiology. 2017; 284(1):228-43. 4. Moderate cardiomegaly. Aortic Atherosclerosis (ICD10-I70.0) and Emphysema (ICD10-J43.9). Electronically Signed   By: ARonney AstersM.D.   On: 05/18/2022 20:04   DG Chest Port 1 View  Result Date: 05/18/2022 CLINICAL DATA:  Weakness, shortness of breath. EXAM: PORTABLE CHEST 1 VIEW COMPARISON:  October 20, 2021. FINDINGS: Stable cardiomegaly. Right paratracheal prominence is noted concerning for adenopathy or mass. Lungs are clear. Bony thorax is unremarkable. IMPRESSION: Right paratracheal prominence is concerning for  adenopathy or mass. CT scan of the chest with intravenous contrast is recommended for further evaluation. Electronically Signed   By: Marijo Conception M.D.   On: 05/18/2022 14:40    Pending Labs Unresulted Labs (From admission, onward)     Start     Ordered   05/19/22 0811  SARS Coronavirus 2 by RT PCR (hospital order, performed in [place] hospital lab) *cepheid single result test* Anterior Nasal Swab  (Tier 2 - Symptomatic/Asymptomatic)  Once,   R         05/19/22 0810   05/18/22 1520  CBC with Differential/Platelet  Once,   STAT        05/18/22 1520            Vitals/Pain Today's Vitals   05/19/22 1034 05/19/22 1100 05/19/22 1200 05/19/22 1212  BP: 115/89 (!) 140/91 129/68   Pulse: 77 77 76   Resp: '20 18 18   '$ Temp: 98.7 F (37.1 C)   98.6 F (37 C)  TempSrc: Oral   Oral  SpO2: 97% 97% 97%   Weight:      Height:      PainSc: 0-No pain       Isolation Precautions Airborne and Contact precautions  Medications Medications  diltiazem (CARDIZEM) 1 mg/mL load via infusion 10 mg (10 mg Intravenous Bolus from Bag 05/18/22 2148)    And  diltiazem (CARDIZEM) 125 mg in dextrose 5% 125 mL (1 mg/mL) infusion (5 mg/hr Intravenous Infusion Verify 05/19/22 0552)  apixaban (ELIQUIS) tablet 5 mg (5 mg Oral Given 05/19/22 1045)  DULoxetine (CYMBALTA) DR capsule 60 mg (60 mg Oral Given 05/19/22 1045)  fluticasone (FLONASE) 50 MCG/ACT nasal spray 1 spray (has no administration in time range)  0.9 %  sodium chloride infusion ( Intravenous New Bag/Given 05/19/22 0447)  arformoterol (BROVANA) nebulizer solution 15 mcg (15 mcg Nebulization Given 05/19/22 1055)    And  umeclidinium bromide (INCRUSE ELLIPTA) 62.5 MCG/ACT 1 puff (1 puff Inhalation Given 05/19/22 1053)  pantoprazole (PROTONIX) EC tablet 40 mg (40 mg Oral Given 05/19/22 1045)  guaiFENesin (MUCINEX) 12 hr tablet 600 mg (600 mg Oral Given 05/19/22 1045)  sodium chloride HYPERTONIC 3 % nebulizer solution 4 mL (4 mLs Nebulization Given 05/19/22 1058)  feeding supplement (ENSURE ENLIVE / ENSURE PLUS) liquid 237 mL (237 mLs Oral Given 05/19/22 1049)  metoprolol tartrate (LOPRESSOR) tablet 12.5 mg (12.5 mg Oral Given 05/19/22 1045)  metoprolol tartrate (LOPRESSOR) injection 2.5 mg (has no administration in time range)  acetaminophen (TYLENOL) tablet 650 mg (has no administration in time range)  ipratropium-albuterol (DUONEB) 0.5-2.5 (3) MG/3ML nebulizer solution 3 mL (3 mLs Nebulization Given  05/19/22 1123)  potassium chloride SA (KLOR-CON M) CR tablet 40 mEq (40 mEq Oral Given 05/19/22 1045)  lactated ringers bolus 250 mL (0 mLs Intravenous Stopped 05/18/22 1439)  ondansetron (ZOFRAN) injection 4 mg (4 mg Intravenous Given 05/18/22 1809)  iohexol (OMNIPAQUE) 350 MG/ML injection 50 mL (50 mLs Intravenous Contrast Given 05/18/22 1941)    Mobility walks Low fall risk   Focused Assessments Cardiac Assessment Handoff:  Cardiac Rhythm: Atrial fibrillation No results found for: "CKTOTAL", "CKMB", "CKMBINDEX", "TROPONINI" No results found for: "DDIMER" Does the Patient currently have chest pain? No    R Recommendations: See Admitting Provider Note  Report given to:   Additional Notes:

## 2022-05-19 NOTE — Telephone Encounter (Signed)
Pharmacy Patient Advocate Encounter  Insurance verification completed.    The patient is insured through [employer] [place] [employer]   The patient is currently admitted and ran test claims for the following: Eliquis '5mg'$ .  Copays and coinsurance results were relayed to Inpatient clinical team.

## 2022-05-19 NOTE — Progress Notes (Signed)
  Echocardiogram 2D Echocardiogram has been performed.  Amanda Sims 05/19/2022, 4:54 PM

## 2022-05-20 DIAGNOSIS — I4891 Unspecified atrial fibrillation: Secondary | ICD-10-CM | POA: Diagnosis not present

## 2022-05-20 LAB — CBC WITH DIFFERENTIAL/PLATELET
Abs Immature Granulocytes: 0.01 10*3/uL (ref 0.00–0.07)
Basophils Absolute: 0 10*3/uL (ref 0.0–0.1)
Basophils Relative: 1 %
Eosinophils Absolute: 0 10*3/uL (ref 0.0–0.5)
Eosinophils Relative: 1 %
HCT: 40.3 % (ref 36.0–46.0)
Hemoglobin: 13.9 g/dL (ref 12.0–15.0)
Immature Granulocytes: 0 %
Lymphocytes Relative: 19 %
Lymphs Abs: 0.9 10*3/uL (ref 0.7–4.0)
MCH: 33.6 pg (ref 26.0–34.0)
MCHC: 34.5 g/dL (ref 30.0–36.0)
MCV: 97.3 fL (ref 80.0–100.0)
Monocytes Absolute: 0.9 10*3/uL (ref 0.1–1.0)
Monocytes Relative: 18 %
Neutro Abs: 2.9 10*3/uL (ref 1.7–7.7)
Neutrophils Relative %: 61 %
Platelets: 96 10*3/uL — ABNORMAL LOW (ref 150–400)
RBC: 4.14 MIL/uL (ref 3.87–5.11)
RDW: 12.6 % (ref 11.5–15.5)
WBC: 4.7 10*3/uL (ref 4.0–10.5)
nRBC: 0 % (ref 0.0–0.2)

## 2022-05-20 LAB — COMPREHENSIVE METABOLIC PANEL
ALT: 31 U/L (ref 0–44)
AST: 35 U/L (ref 15–41)
Albumin: 3.3 g/dL — ABNORMAL LOW (ref 3.5–5.0)
Alkaline Phosphatase: 80 U/L (ref 38–126)
Anion gap: 8 (ref 5–15)
BUN: 11 mg/dL (ref 8–23)
CO2: 27 mmol/L (ref 22–32)
Calcium: 8.8 mg/dL — ABNORMAL LOW (ref 8.9–10.3)
Chloride: 98 mmol/L (ref 98–111)
Creatinine, Ser: 0.57 mg/dL (ref 0.44–1.00)
GFR, Estimated: >60 mL/min (ref >60)
Glucose, Bld: 109 mg/dL — ABNORMAL HIGH (ref 70–99)
Potassium: 4.6 mmol/L (ref 3.5–5.1)
Sodium: 133 mmol/L — ABNORMAL LOW (ref 135–145)
Total Bilirubin: 0.7 mg/dL (ref 0.3–1.2)
Total Protein: 6.3 g/dL — ABNORMAL LOW (ref 6.5–8.1)

## 2022-05-20 LAB — BRAIN NATRIURETIC PEPTIDE: B Natriuretic Peptide: 248.2 pg/mL — ABNORMAL HIGH (ref 0.0–100.0)

## 2022-05-20 LAB — FERRITIN: Ferritin: 124 ng/mL (ref 11–307)

## 2022-05-20 LAB — MAGNESIUM: Magnesium: 1.9 mg/dL (ref 1.7–2.4)

## 2022-05-20 LAB — D-DIMER, QUANTITATIVE: D-Dimer, Quant: 1.01 ug/mL-FEU — ABNORMAL HIGH (ref 0.00–0.50)

## 2022-05-20 LAB — C-REACTIVE PROTEIN: CRP: 0.6 mg/dL (ref <1.0)

## 2022-05-20 MED ORDER — MOLNUPIRAVIR EUA 200MG CAPSULE
4.0000 | ORAL_CAPSULE | Freq: Two times a day (BID) | ORAL | Status: DC
  Administered 2022-05-20: 800 mg via ORAL
  Filled 2022-05-20: qty 4

## 2022-05-20 MED ORDER — METOPROLOL SUCCINATE ER 25 MG PO TB24
25.0000 mg | ORAL_TABLET | Freq: Every day | ORAL | Status: DC
  Administered 2022-05-20: 25 mg via ORAL
  Filled 2022-05-20: qty 1

## 2022-05-20 MED ORDER — GUAIFENESIN ER 600 MG PO TB12: 600.0000 mg | ORAL_TABLET | Freq: Two times a day (BID) | ORAL | 0 refills | Status: AC

## 2022-05-20 MED ORDER — APIXABAN 5 MG PO TABS: 5.0000 mg | ORAL_TABLET | Freq: Two times a day (BID) | ORAL | 2 refills | Status: DC

## 2022-05-20 MED ORDER — METOPROLOL SUCCINATE ER 50 MG PO TB24: 50.0000 mg | ORAL_TABLET | Freq: Every day | ORAL | Status: AC

## 2022-05-20 MED ORDER — MOLNUPIRAVIR EUA 200MG CAPSULE: 4.0000 | ORAL_CAPSULE | Freq: Two times a day (BID) | ORAL | 0 refills | Status: AC

## 2022-05-20 NOTE — Progress Notes (Signed)
Patient discharging home. Vital signs stable at time of discharge as reflected in discharge summary. Discharge instructions given and verbal understanding returned. No questions at this time. Patient refused rollator and it was returned to facility

## 2022-05-20 NOTE — Evaluation (Signed)
Occupational Therapy Evaluation Patient Details Name: Amanda Sims MRN: [id number] DOB: [birth year] Today's Date: 05/20/2022   History of Present Illness Amanda Sims is a 81 y.o. female admittted 9/25 who presented to [facility] ED with complaints of generalized fatigue and cough.  Associated with a subjective fever with Tmax of 101.1. Pt with afib and ? mucus plug. PMH:  bronchiectasis, Mycobacterium Avium, COPD (not on O2 supplementation at baseline), history of hemoptysis, dyspnea on exertion, hypertension   Clinical Impression   Pt independent at baseline with ADLs and functional mobility, lives in [place] with spouse. Pt currently needing mod I -minA for ADLs, independent wthi bed mobility, and min guard for transfers and in room ambulation without AD. Pt states she feels she is at baseline. Educated pt on energy conservation strategies for home including use of shower seat, pt verbalized understanding. Pt presenting with impairments listed below, will follow acutely. Recommend d/c home with family assistance.      Recommendations for follow up therapy are one component of a multi-disciplinary discharge planning process, led by the attending physician.  Recommendations may be updated based on patient status, additional functional criteria and insurance authorization.   Follow Up Recommendations  No OT follow up    Assistance Recommended at Discharge PRN  Patient can return home with the following Assistance with cooking/housework    Functional Status Assessment  Patient has had a recent decline in their functional status and demonstrates the ability to make significant improvements in function in a reasonable and predictable amount of time.  Equipment Recommendations  None recommended by OT (pt has all needed DME)    Recommendations for Other Services PT consult     Precautions / Restrictions Precautions Precautions: Fall Precaution Comments: COVID Restrictions Weight Bearing  Restrictions: No      Mobility Bed Mobility Overal bed mobility: Independent                  Transfers Overall transfer level: Needs assistance Equipment used: None Transfers: Sit to/from Stand Sit to Stand: Min guard                  Balance Overall balance assessment: Needs assistance Sitting-balance support: No upper extremity supported, Feet supported Sitting balance-Leahy Scale: Good     Standing balance support: No upper extremity supported, During functional activity, Single extremity supported Standing balance-Leahy Scale: Poor                             ADL either performed or assessed with clinical judgement   ADL Overall ADL's : Needs assistance/impaired Eating/Feeding: Modified independent   Grooming: Modified independent   Upper Body Bathing: Min guard;Sitting   Lower Body Bathing: Minimal assistance;Sitting/lateral leans;Sit to/from stand;Min guard   Upper Body Dressing : Min guard;Sitting   Lower Body Dressing: Minimal assistance;Sitting/lateral leans;Sit to/from stand;Min guard   Toilet Transfer: Supervision/safety   Writer and Hygiene: Supervision/safety       Functional mobility during ADLs: Supervision/safety       Vision   Vision Assessment?: No apparent visual deficits     Perception     Praxis      Pertinent Vitals/Pain Pain Assessment Pain Assessment: No/denies pain     Hand Dominance Right   Extremity/Trunk Assessment Upper Extremity Assessment Upper Extremity Assessment: Overall WFL for tasks assessed   Lower Extremity Assessment Lower Extremity Assessment: Overall WFL for tasks assessed   Cervical / Trunk  Assessment Cervical / Trunk Assessment: Normal   Communication Communication Communication: No difficulties   Cognition Arousal/Alertness: Awake/alert Behavior During Therapy: WFL for tasks assessed/performed Overall Cognitive Status: Within Functional Limits  for tasks assessed                                 General Comments: mild slow processing     General Comments  VSS On RA during session    Exercises     Shoulder Instructions      Home Living Family/patient expects to be discharged to:: Other (Comment) ([place], [facility]) Living Arrangements: Spouse/significant other Available Help at Discharge: Family;Available PRN/intermittently (spouse is HD TTS pt) Type of Home: Independent living facility Home Access: Level entry;Elevator     Home Layout: One level     Bathroom Shower/Tub: Tub/shower unit;Walk-in shower (reports lower than average height)   Bathroom Toilet: Handicapped height     Home Equipment: Grab bars - toilet;Grab bars - tub/shower;BSC/3in1   Additional Comments: can use husbands walk in shower      Prior Functioning/Environment Prior Level of Function : Independent/Modified Independent;Driving             Mobility Comments: no AD use ADLs Comments: ind; does med mgmt, facility provides meals        OT Problem List: Decreased strength;Decreased range of motion;Decreased activity tolerance;Decreased safety awareness      OT Treatment/Interventions: Self-care/ADL training;Therapeutic exercise;Energy conservation;DME and/or AE instruction;Therapeutic activities;Patient/family education;Balance training    OT Goals(Current goals can be found in the care plan section) Acute Rehab OT Goals Patient Stated Goal: none stated OT Goal Formulation: With patient Time For Goal Achievement: 06/03/22 Potential to Achieve Goals: Good ADL Goals Pt Will Perform Upper Body Dressing: Independently;sitting;standing Pt Will Perform Lower Body Dressing: Independently;sitting/lateral leans;sit to/from stand Pt Will Transfer to Toilet: Independently;ambulating;regular height toilet Pt Will Perform Tub/Shower Transfer: Tub transfer;Shower transfer;ambulating;shower seat;with modified independence  OT  Frequency: Min 2X/week    Co-evaluation              AM-PAC OT "6 Clicks" Daily Activity     Outcome Measure Help from another person eating meals?: None Help from another person taking care of personal grooming?: None Help from another person toileting, which includes using toliet, bedpan, or urinal?: A Little Help from another person bathing (including washing, rinsing, drying)?: A Little Help from another person to put on and taking off regular upper body clothing?: A Little Help from another person to put on and taking off regular lower body clothing?: A Little 6 Click Score: 20   End of Session Nurse Communication: Mobility status  Activity Tolerance: Patient tolerated treatment well Patient left: in bed;with call bell/phone within reach  OT Visit Diagnosis: Muscle weakness (generalized) (M62.81)                Time: 8841-6606 OT Time Calculation (min): 27 min Charges:  OT General Charges $OT Visit: 1 Visit OT Evaluation $OT Eval Low Complexity: 1 Low OT Treatments $Self Care/Home Management : 8-22 mins  Lynnda Child, OTD, OTR/L Acute Rehab [phone] - [place] 05/20/2022, 12:29 PM

## 2022-05-20 NOTE — TOC Progression Note (Addendum)
Transition of Care [facility]) - Progression Note    Patient Details  Name: DEANN MCLAINE MRN: [id number] Date of Birth: [birth year]  Transition of Care [facility]) CM/SW Contact  Zenon Mayo, RN Phone Number: 05/20/2022, 10:33 AM  Clinical Narrative:    Patient is from [facility], presents with new onset afib, she will be started on eliquis, NCM informed her of the co pay amount of 47.00 from the benefit check.  NCM offered choice for HHPT  with [email or web address], over phone, she states that they have physical therapy at her IDL facility and she would like to have it done there.  This NCM contacted [facility] at [phone], fax is [phone].  NCM will fax the HHPT orders to [facility].  Per physical therapy they are recommending a rollator, patient states yes she needs a rollator , she is ok with [employer] supplying this for her.  Will need HHPT order , so can fax to [employer]. Patient states her daughter gets off work at 1 pm , so she will be the one transporting her home today.         Expected Discharge Plan and Services                                                 Social Determinants of Health (SDOH) Interventions    Readmission Risk Interventions     No data to display

## 2022-05-20 NOTE — TOC Transition Note (Addendum)
Transition of Care [facility]) - CM/SW Discharge Note   Patient Details  Name: Amanda Sims MRN: [id number] Date of Birth: 10-18-1940  Transition of Care [facility]) CM/SW Contact:  Zenon Mayo, RN Phone Number: 05/20/2022, 11:00 AM   Clinical Narrative:    Patient is for dc today, daughter gets off work at 1pm she will transport patient home today.  NCM will fax orders to [facility] for HHPT once orders in.  NCM made referral to [employer] for rollator, it will be deliverd to room prior to dc. Per MD verbal order for rollator, will send thru [employer].   The rollator was delivered to patient's room, she states she changed her mind she does not want the rollator, she this NCM will have [employer] to come back to pick up the rollator.           Patient Goals and CMS Choice        Discharge Placement                       Discharge Plan and Services                                     Social Determinants of Health (SDOH) Interventions     Readmission Risk Interventions     No data to display

## 2022-05-21 DIAGNOSIS — I071 Rheumatic tricuspid insufficiency: Secondary | ICD-10-CM

## 2022-05-21 DIAGNOSIS — E871 Hypo-osmolality and hyponatremia: Secondary | ICD-10-CM

## 2022-05-21 DIAGNOSIS — E8729 Other acidosis: Secondary | ICD-10-CM

## 2022-05-21 NOTE — Discharge Summary (Signed)
Physician Discharge Summary   Patient: Amanda Sims MRN: [id number] DOB: Jan 06, 1941  Admit date:     05/18/2022  Discharge date: 05/20/2022  Discharge Physician: Amanda Sims   PCP: Amanda Stalker, PA-C   Recommendations at discharge:  PCP follow-up Cardiology follow-up for atrial fibrillation and tricuspid valve regurgitation  Discharge Diagnoses: Principal Problem:   New onset atrial fibrillation Amanda Sims) Active Problems:   Atrial fibrillation (HCC)   Tricuspid valve regurgitation   Hyponatremia   High anion gap metabolic acidosis  Resolved Problems:   * No resolved Sims problems. *  Sims Course: Amanda Sims is a 81 y.o. female with a history of bronchiectasis, MAI, COPD. Patient presented secondary to fatigue and cough and found have COVID-19 infection with associated fever. No hypoxia or pneumonia. While in the ED, patient developed atrial fibrillation with RVR. Patient managed on Cardizem drip and Eliquis. Cardizem drip transitioned back to home metoprolol. Transthoracic Echocardiogram significant for moderate-severe tricuspid valve regurgitation.  Assessment and Plan:  Atrial fibrillation New onset. In setting of COVID-19 infection. TSH normal. Transthoracic Echocardiogram was significant for LVEF of 50-55% and biatrial enlargement. Patient managed on Cardizem drip and Eliquis. Home metoprolol restarted and patient was weaned off of Cardizem drip with continued stable heart rate. Patient discharged on metoprolol and Eliquis. Recommendation for patient to follow-up with cardiology on discharge.  COVID-19 virus infection Fever and malaise with some cough. No hypoxia or pneumonia. Patient started on molnupiravir.   Tricuspid valve regurgitation Moderate-severe. Noted on Transthoracic Echocardiogram. Recommendation for cardiology follow-up.  Hypovolemic hyponatremia Mild with sodium of 131 on admission. Improved prior to discharge.  Anion gap metabolic  acidosis Improved.  Primary hypertension Continue home amlodipine and lisinopril.  Hypokalemia Replaced.  Elevated AST Transient. Resolved.  Chronic MAI infection Noted.  Generalized fatigue In setting of acute illness. PT/OT consulted and recommended home health therapy services.    Consultants: None Procedures performed: Transthoracic Echocardiogram   Disposition: Independent living Diet recommendation: Regular diet  DISCHARGE MEDICATION: Allergies as of 05/20/2022   No Known Allergies      Medication List     TAKE these medications    acetaminophen 650 MG CR tablet Commonly known as: TYLENOL Take 650 mg by mouth as needed for pain.   amLODipine 5 MG tablet Commonly known as: NORVASC Take 5 mg by mouth daily.   apixaban 5 MG Tabs tablet Commonly known as: ELIQUIS Take 1 tablet (5 mg total) by mouth 2 (two) times daily.   CALCIUM PO Take 1 tablet by mouth daily.   cetirizine 10 MG tablet Commonly known as: ZYRTEC Take 10 mg by mouth daily.   DULoxetine 60 MG capsule Commonly known as: CYMBALTA Take 1 capsule by mouth daily.   guaiFENesin 600 MG 12 hr tablet Commonly known as: MUCINEX Take 1 tablet (600 mg total) by mouth 2 (two) times daily for 5 days.   lisinopril 40 MG tablet Commonly known as: ZESTRIL Take 1 tablet by mouth daily.   metoprolol succinate 50 MG 24 hr tablet Commonly known as: TOPROL-XL Take 1 tablet (50 mg total) by mouth daily.   molnupiravir EUA 200 mg Caps capsule Commonly known as: LAGEVRIO Take 4 capsules (800 mg total) by mouth 2 (two) times daily for 5 days.   Stiolto Respimat 2.5-2.5 MCG/ACT Aers Generic drug: Tiotropium Bromide-Olodaterol Inhale 2 puffs into the lungs daily.   VITAMIN D-3 PO Take 2 tablets by mouth daily.        Follow-up Information  Amanda Stalker, PA-C. Go on 06/01/2022.   Specialty: Family Medicine Why: '@1'$ :00pm Contact information: [address]  [address] [phone]         [facility] onsite Follow up.   Why: HHPT- will be done onsite at [facility] Contact information: [place], [facility] Follow up.   Why: rollator Contact information: [address] [place] [address] [phone]         Minus Breeding, MD Follow up on 06/02/2022.   Specialty: Cardiology Why: 2:40 PM, For Sims follow-up. Tricuspid valve regurgitation. Atrial fibrillation. Contact information: [address] [place] [address] [phone]                Discharge Exam: BP (!) 145/83 (BP Location: Right Arm)   Pulse 82   Temp (!) 97.5 F (36.4 C) (Oral)   Resp 18   Ht '5\' 4"'$  (1.626 m)   Wt 71.7 kg   SpO2 96%   BMI 27.14 kg/m   General: Well appearing, no distress  Condition at discharge: stable  The results of significant diagnostics from this hospitalization (including imaging, microbiology, ancillary and laboratory) are listed below for reference.   Imaging Studies: ECHOCARDIOGRAM COMPLETE  Result Date: 05/19/2022    ECHOCARDIOGRAM REPORT   Patient Name:   Amanda Sims Date of Exam: 05/19/2022 Medical Rec #:  [id number]        Height:       64.0 in Accession #:    [id number]       Weight:       155.0 lb Date of Birth:  December 19, 1940        BSA:          1.756 m Patient Age:    70 years         BP:           141/77 mmHg Patient Gender: F                HR:           66 bpm. Exam Location:  Inpatient Procedure: 2D Echo, 3D Echo, Color Doppler and Cardiac Doppler Indications:    A-Fib  History:        Patient has no prior history of Echocardiogram examinations.                 Arrythmias:Atrial Fibrillation.  Sonographer:    Greer Pickerel Referring Phys: [id number] Kayleen Memos  Sonographer Comments: Patient is obese. Image acquisition challenging due to respiratory motion. IMPRESSIONS  1. Left ventricular ejection fraction, by estimation, is 50 to 55%. The left ventricle has low normal function. The  left ventricle has no regional wall motion abnormalities. There is mild concentric left ventricular hypertrophy. Left ventricular diastolic parameters are indeterminate.  2. Right ventricular systolic function is normal. The right ventricular size is mildly enlarged. There is moderately elevated pulmonary artery systolic pressure. The estimated right ventricular systolic pressure is 99.3 mmHg.  3. Left atrial size was severely dilated.  4. Right atrial size was severely dilated.  5. The mitral valve is grossly normal. Mild mitral valve regurgitation.  6. Tricuspid valve regurgitation is moderate to severe, during some RR intervals there is hepatic vein flow reversal.  7. The aortic valve is tricuspid. There is mild calcification of the aortic valve. There is mild thickening of the aortic valve. Aortic valve regurgitation is mild. Aortic valve sclerosis is present,  with no evidence of aortic valve stenosis. Comparison(s): No prior Echocardiogram. FINDINGS  Left Ventricle: Left ventricular ejection fraction, by estimation, is 50 to 55%. The left ventricle has low normal function. The left ventricle has no regional wall motion abnormalities. The left ventricular internal cavity size was normal in size. There is mild concentric left ventricular hypertrophy. Left ventricular diastolic parameters are indeterminate. Right Ventricle: The right ventricular size is mildly enlarged. No increase in right ventricular wall thickness. Right ventricular systolic function is normal. There is moderately elevated pulmonary artery systolic pressure. The tricuspid regurgitant velocity is 3.17 m/s, and with an assumed right atrial pressure of 15 mmHg, the estimated right ventricular systolic pressure is 35.5 mmHg. Left Atrium: Left atrial size was severely dilated. Right Atrium: Right atrial size was severely dilated. Pericardium: There is no evidence of pericardial effusion. Mitral Valve: MVA by 2D Planimetry 2.67 cm2. The mitral valve  is grossly normal. Mild mitral valve regurgitation. The mean mitral valve gradient is 1.0 mmHg with average heart rate of 74 bpm. Tricuspid Valve: The tricuspid valve is normal in structure. Tricuspid valve regurgitation is moderate to severe. No evidence of tricuspid stenosis. Aortic Valve: The aortic valve is tricuspid. There is mild calcification of the aortic valve. There is mild thickening of the aortic valve. Aortic valve regurgitation is mild. Aortic valve sclerosis is present, with no evidence of aortic valve stenosis. Pulmonic Valve: The pulmonic valve was normal in structure. Pulmonic valve regurgitation is trivial. No evidence of pulmonic stenosis. Aorta: The aortic root and ascending aorta are structurally normal, with no evidence of dilitation. IAS/Shunts: No atrial level shunt detected by color flow Doppler.  LEFT VENTRICLE PLAX 2D LVIDd:         4.70 cm   Diastology LVIDs:         3.30 cm   LV e' medial:    7.30 cm/s LV PW:         1.00 cm   LV E/e' medial:  13.0 LV IVS:        1.10 cm   LV e' lateral:   10.90 cm/s LVOT diam:     1.80 cm   LV E/e' lateral: 8.7 LV SV:         45 LV SV Index:   26 LVOT Area:     2.54 cm                           3D Volume EF:                          3D EF:        49 %                          LV EDV:       64 ml                          LV ESV:       33 ml                          LV SV:        31 ml RIGHT VENTRICLE RV S prime:     9.32 cm/s TAPSE (M-mode): 2.0 cm LEFT ATRIUM           Index  RIGHT ATRIUM           Index LA diam:      4.20 cm 2.39 cm/m   RA Area:     36.30 cm LA Vol (A4C): 93.1 ml 53.03 ml/m  RA Volume:   122.00 ml 69.50 ml/m  AORTIC VALVE LVOT Vmax:   90.70 cm/s LVOT Vmean:  60.100 cm/s LVOT VTI:    0.177 m  AORTA Ao Root diam: 3.30 cm Ao Asc diam:  3.20 cm MITRAL VALVE               TRICUSPID VALVE MV Area (PHT): 6.96 cm    TR Peak grad:   40.2 mmHg MV Mean grad:  1.0 mmHg    TR Vmax:        317.00 cm/s MV Decel Time: 109 msec MV E  velocity: 94.60 cm/s  SHUNTS MV A velocity: 31.70 cm/s  Systemic VTI:  0.18 m MV E/A ratio:  2.98        Systemic Diam: 1.80 cm Rudean Haskell MD Electronically signed by Rudean Haskell MD Signature Date/Time: 05/19/2022/5:35:14 PM    Final    CT Chest W Contrast  Result Date: 05/18/2022 CLINICAL DATA:  Mild shortness of breath.  Lung nodule. EXAM: CT CHEST WITH CONTRAST TECHNIQUE: Multidetector CT imaging of the chest was performed during intravenous contrast administration. RADIATION DOSE REDUCTION: This exam was performed according to the departmental dose-optimization program which includes automated exposure control, adjustment of the mA and/or kV according to patient size and/or use of iterative reconstruction technique. CONTRAST:  22m OMNIPAQUE IOHEXOL 350 MG/ML SOLN COMPARISON:  Chest x-ray same day FINDINGS: Cardiovascular: The heart is moderately enlarged. Aorta is normal in size. There are atherosclerotic calcifications of the aorta. There is no pericardial effusion. Mediastinum/Nodes: No enlarged mediastinal, hilar, or axillary lymph nodes. Thyroid gland, trachea, and esophagus demonstrate no significant findings. Lungs/Pleura: There is scarring in both lung apices. There is some peripheral bronchiectasis and mucous plugging in the anterior right upper lobe and right middle lobe. There is a 3 mm nodule in the right upper lobe image 5/64. Minimal emphysematous changes are present. There is atelectasis in the lingula. There is a calcified granuloma in the right lower lobe. No pleural effusion or pneumothorax. Trachea and central airways are patent. Upper Abdomen: No acute abnormality. Musculoskeletal: No chest wall abnormality. No acute or significant osseous findings. IMPRESSION: 1. Minimal bronchiectasis and peripheral mucous plugging in the right upper lobe and right middle lobe. 2. Atelectasis in the lingula. 3. 3 mm right solid pulmonary nodule within the upper lobe. Per Fleischner  Society Guidelines, a non-contrast Chest CT at 12 months is optional. If performed and the nodule is stable at 12 months, no further follow-up is recommended. These guidelines do not apply to immunocompromised patients and patients with cancer. Follow up in patients with significant comorbidities as clinically warranted. For lung cancer screening, adhere to Lung-RADS guidelines. Reference: Radiology. 2017; 284(1):228-43. 4. Moderate cardiomegaly. Aortic Atherosclerosis (ICD10-I70.0) and Emphysema (ICD10-J43.9). Electronically Signed   By: ARonney AstersM.D.   On: 05/18/2022 20:04   DG Chest Port 1 View  Result Date: 05/18/2022 CLINICAL DATA:  Weakness, shortness of breath. EXAM: PORTABLE CHEST 1 VIEW COMPARISON:  October 20, 2021. FINDINGS: Stable cardiomegaly. Right paratracheal prominence is noted concerning for adenopathy or mass. Lungs are clear. Bony thorax is unremarkable. IMPRESSION: Right paratracheal prominence is concerning for adenopathy or mass. CT scan of the chest with intravenous contrast is recommended for further evaluation.  Electronically Signed   By: Marijo Conception M.D.   On: 05/18/2022 14:40    Microbiology: Results for orders placed or performed during the Sims encounter of 05/18/22  SARS Coronavirus 2 by RT PCR (Sims order, performed in [facility]) *cepheid single result test* Anterior Nasal Swab     Status: Abnormal   Collection Time: 05/19/22  8:11 AM   Specimen: Anterior Nasal Swab  Result Value Ref Range Status   SARS Coronavirus 2 by RT PCR POSITIVE (A) NEGATIVE Final    Comment: (NOTE) SARS-CoV-2 target nucleic acids are DETECTED  SARS-CoV-2 RNA is generally detectable in upper respiratory specimens  during the acute phase of infection.  Positive results are indicative  of the presence of the identified virus, but do not rule out bacterial infection or co-infection with other pathogens not detected by the test.  Clinical correlation with patient  history and  other diagnostic information is necessary to determine patient infection status.  The expected result is negative.  Fact Sheet for Patients:   https://www.patel.info/   Fact Sheet for Healthcare Providers:   https://hall.com/    This test is not yet approved or cleared by the [place] FDA and  has been authorized for detection and/or diagnosis of SARS-CoV-2 by FDA under an Emergency Use Authorization (EUA).  This EUA will remain in effect (meaning this test can be used) for the duration of  the COVID-19 declaration under Section 564(b)(1)  of the Act, 21 U.S.C. section 360-bbb-3(b)(1), unless the authorization is terminated or revoked sooner.   Performed at [facility], [place] [address] [place],  [address]     Labs: CBC: Recent Labs  Lab 05/18/22 1358 05/19/22 0328 05/20/22 0110  WBC 7.1 5.7 4.7  NEUTROABS 5.6  --  2.9  HGB 14.9 13.7 13.9  HCT 43.2 41.4 40.3  MCV 99.5 100.7* 97.3  PLT 171 125* 96*   Basic Metabolic Panel: Recent Labs  Lab 05/18/22 1549 05/19/22 0328 05/20/22 0110  NA 131* 130* 133*  K 4.2 3.1* 4.6  CL 98 96* 98  CO2 21* 25 27  GLUCOSE 111* 109* 109*  BUN 9 6* 11  CREATININE 0.53 0.52 0.57  CALCIUM 8.4* 8.7* 8.8*  MG  --  1.9 1.9  PHOS  --  2.9  --    Liver Function Tests: Recent Labs  Lab 05/18/22 1549 05/19/22 0328 05/20/22 0110  AST 43* 36 35  ALT 34 36 31  ALKPHOS 89 81 80  BILITOT 1.0 1.0 0.7  PROT 6.2* 6.6 6.3*  ALBUMIN 3.4* 3.4* 3.3*    Discharge time spent: 35 minutes.  Signed: Cordelia Poche, MD [facility] 05/21/2022

## 2022-05-21 NOTE — Hospital Course (Signed)
Amanda Sims is a 81 y.o. female with a history of bronchiectasis, MAI, COPD. Patient presented secondary to fatigue and cough and found have COVID-19 infection with associated fever. No hypoxia or pneumonia. While in the ED, patient developed atrial fibrillation with RVR. Patient managed on Cardizem drip and Eliquis. Cardizem drip transitioned back to home metoprolol. Transthoracic Echocardiogram significant for moderate-severe tricuspid valve regurgitation.

## 2022-05-31 DIAGNOSIS — I2729 Other secondary pulmonary hypertension: Secondary | ICD-10-CM | POA: Insufficient documentation

## 2022-05-31 DIAGNOSIS — I272 Pulmonary hypertension, unspecified: Secondary | ICD-10-CM | POA: Insufficient documentation

## 2022-05-31 NOTE — Progress Notes (Deleted)
Cardiology Office Note   Date:  05/31/2022   ID:  Amanda Sims, DOB April 12, 1941, MRN [id number]  PCP:  Marda Stalker, PA-C  Cardiologist:   None Referring:  ***  No chief complaint on file.     History of Present Illness: Amanda Sims is a 81 y.o. female who presents for evaluation of atrial fib.  She ws in the hospital with COVID 19 complicating her chronic lung disease.  While in the ED prior to admission she developed atrial fib with RVR.  She was treated with rate control and anticoagulation.  She had TTE with a low normal EF and moderately severe pulmonary HTN with TR.  LA was severely enlarged.  There was only mild MR.  I reviewed the hospital records for this visit.  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     amLODipine (NORVASC) 5 MG tablet Take 5 mg by mouth daily.     apixaban (ELIQUIS) 5 MG TABS tablet Take 1 tablet (5 mg total) by mouth 2 (two) times daily. 60 tablet 2   CALCIUM PO Take 1 tablet by mouth daily.     cetirizine (ZYRTEC) 10 MG tablet Take 10 mg by mouth daily.     Cholecalciferol (VITAMIN D-3 PO) Take 2 tablets by mouth daily.     DULoxetine (CYMBALTA) 60 MG capsule Take 1 capsule by mouth daily.  0   lisinopril (PRINIVIL,ZESTRIL) 40 MG tablet Take 1 tablet by mouth daily.  0   metoprolol succinate (TOPROL-XL) 50 MG 24 hr tablet Take 1 tablet (50 mg total) by mouth daily.     Tiotropium Bromide-Olodaterol (STIOLTO RESPIMAT) 2.5-2.5 MCG/ACT AERS Inhale 2 puffs into the lungs daily. (Patient not taking: Reported on 05/19/2022) 4 g 3   No current  facility-administered medications for this visit.    Allergies:   Patient has no known allergies.    Social History:  The patient  reports that she has never smoked. She has never used smokeless tobacco. She reports current alcohol use. She reports that she does not use drugs.   Family History:  The patient's ***family history is not on file.    ROS:  Please see the history of present illness.   Otherwise, review of systems are positive for {NONE DEFAULTED:18576}.   All other systems are reviewed and negative.    PHYSICAL EXAM: VS:  There were no vitals taken for this visit. , BMI There is no height or weight on file to calculate BMI. GENERAL:  Well appearing HEENT:  Pupils equal round and reactive, fundi not visualized, oral mucosa unremarkable NECK:  No jugular venous distention, waveform within normal limits, carotid upstroke brisk and symmetric, no bruits, no thyromegaly LYMPHATICS:  No cervical, inguinal adenopathy LUNGS:  Clear to auscultation bilaterally BACK:  No CVA tenderness CHEST:  Unremarkable HEART:  PMI not displaced or sustained,S1 and S2 within normal limits, no S3, no clicks, no rubs, *** murmurs, irregular  ABD:  Flat, positive bowel sounds normal in frequency in pitch, no bruits, no rebound, no  guarding, no midline pulsatile mass, no hepatomegaly, no splenomegaly EXT:  2 plus pulses throughout, no edema, no cyanosis no clubbing SKIN:  No rashes no nodules NEURO:  Cranial nerves II through XII grossly intact, motor grossly intact throughout PSYCH:  Cognitively intact, oriented to person place and time    EKG:  EKG {ACTION; IS/IS BCW:[id number]} ordered today. The ekg ordered today demonstrates ***   Recent Labs: 05/19/2022: TSH 0.999 05/20/2022: ALT 31; B Natriuretic Peptide 248.2; BUN 11; Creatinine, Ser 0.57; Hemoglobin 13.9; Magnesium 1.9; Platelets 96; Potassium 4.6; Sodium 133    Lipid Panel No results found for: "CHOL", "TRIG", "HDL", "CHOLHDL", "VLDL",  "LDLCALC", "LDLDIRECT"    Wt Readings from Last 3 Encounters:  05/19/22 158 lb 1.6 oz (71.7 kg)  12/05/21 161 lb (73 kg)  10/20/21 156 lb 6.4 oz (70.9 kg)      Other studies Reviewed: Additional studies/ records that were reviewed today include: ***. Review of the above records demonstrates:  Please see elsewhere in the note.  ***   ASSESSMENT AND PLAN:  Atrial fib:  ***  Pulmonary HTN:  ***  TR:  ***    Current medicines are reviewed at length with the patient today.  The patient {ACTIONS; HAS/DOES NOT HAVE:19233} concerns regarding medicines.  The following changes have been made:  {PLAN; NO CHANGE:13088:s}  Labs/ tests ordered today include: *** No orders of the defined types were placed in this encounter.    Disposition:   FU with ***    Signed, Minus Breeding, MD  05/31/2022 10:00 PM    [place]

## 2022-06-01 DIAGNOSIS — K011 Impacted teeth: Secondary | ICD-10-CM | POA: Diagnosis not present

## 2022-06-02 ENCOUNTER — Ambulatory Visit: Payer: Medicare Other | Admitting: Cardiology

## 2022-06-02 DIAGNOSIS — I4891 Unspecified atrial fibrillation: Secondary | ICD-10-CM

## 2022-06-02 DIAGNOSIS — I272 Pulmonary hypertension, unspecified: Secondary | ICD-10-CM

## 2022-06-02 DIAGNOSIS — I361 Nonrheumatic tricuspid (valve) insufficiency: Secondary | ICD-10-CM

## 2022-06-05 DIAGNOSIS — R2689 Other abnormalities of gait and mobility: Secondary | ICD-10-CM | POA: Diagnosis not present

## 2022-06-09 DIAGNOSIS — R2689 Other abnormalities of gait and mobility: Secondary | ICD-10-CM | POA: Diagnosis not present

## 2022-06-16 DIAGNOSIS — R2689 Other abnormalities of gait and mobility: Secondary | ICD-10-CM | POA: Diagnosis not present

## 2022-06-21 NOTE — Progress Notes (Unsigned)
Cardiology Office Note:    Date:  06/22/2022   ID:  Amanda Sims, DOB 10-Jul-1941, MRN [id number]  PCP:  Marda Stalker, PA-C  Cardiologist:  None  Electrophysiologist:  None   Referring MD: Marda Stalker, PA-C   Chief Complaint  Patient presents with   Atrial Fibrillation    History of Present Illness:    Amanda Sims is a 81 y.o. female with a hx of bronchiectasis, MAI who is referred by Marda Stalker, PA for evaluation of atrial fibrillation.  She was admitted 04/2022 with fatigue and cough, found to have COVID-19 infection.  While in ED, noted to be in A-fib with RVR.  She was started on Eliquis and initially diltiazem drip, switch to p.o. metoprolol on discharge.  Echocardiogram 05/19/2022 showed EF 50 to 55%, mild LVH, normal RV function, RVSP 55, severe biatrial enlargement, moderate to severe TR.  She denies having palpitations.  Reports has had couple episodes of chest tightness over the last few months.  Describes as tightness in center of her chest when stressed.  She has not been exercising.  Does report some shortness of breath when she exerts herself.  Also having some lightheadedness, denies any syncope.  Does report having some lower extremity edema, worsened when her amlodipine was increased to 10 mg daily but has improved since back to 5 mg daily.  She is taking Eliquis, denies any bleeding issues.  Missed doses of Eliquis over the weekend.  No family history of heart disease.    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   amLODipine (NORVASC) 5 MG tablet Take 5 mg by mouth daily.    apixaban (ELIQUIS) 5 MG TABS tablet Take 1 tablet (5 mg total) by mouth 2 (two) times daily.   CALCIUM PO Take 1 tablet by mouth daily.   cetirizine (ZYRTEC) 10 MG tablet Take 10 mg by mouth daily.   Cholecalciferol (VITAMIN D-3 PO) Take 2 tablets by mouth daily.   DULoxetine (CYMBALTA) 60 MG capsule Take 1 capsule by mouth daily.   lisinopril (PRINIVIL,ZESTRIL) 40 MG tablet Take 1 tablet by mouth daily.   metoprolol succinate (TOPROL-XL) 50 MG 24 hr tablet Take 1 tablet (50 mg total) by mouth daily.     Allergies:   Patient has no known allergies.   Social History   Socioeconomic History   Marital status: Married    Spouse name: Not on file   Number of children: Not on file   Years of education: Not on file   Highest education level: Not on file  Occupational History   Not on file  Tobacco Use   Smoking status: Never   Smokeless tobacco: Never  Vaping Use   Vaping Use: Never used  Substance and Sexual Activity   Alcohol use: Yes   Drug use: No   Sexual activity: Not on file  Other Topics Concern   Not on file  Social History Narrative   Not on file   Social Determinants of Health   Financial Resource Strain: Not on file  Food Insecurity: No Food Insecurity (05/19/2022)  Hunger Vital Sign    Worried About Running Out of Food in the Last Year: Never true    Ran Out of Food in the Last Year: Never true  Transportation Needs: No Transportation Needs (05/19/2022)   PRAPARE - Hydrologist (Medical): No    Lack of Transportation (Non-Medical): No  Physical Activity: Not on file  Stress: Not on file  Social Connections: Not on file     Family History: No family history of heart disease.    ROS:   Please see the history of present illness.     All other systems reviewed and are negative.  EKGs/Labs/Other Studies Reviewed:    The following studies were reviewed today:   EKG:   06/22/22: Afib, rate 71  Recent Labs: 05/19/2022: TSH  0.999 05/20/2022: ALT 31; B Natriuretic Peptide 248.2; BUN 11; Creatinine, Ser 0.57; Hemoglobin 13.9; Magnesium 1.9; Platelets 96; Potassium 4.6; Sodium 133  Recent Lipid Panel No results found for: "CHOL", "TRIG", "HDL", "CHOLHDL", "VLDL", "LDLCALC", "LDLDIRECT"  Physical Exam:    VS:  BP 132/84   Pulse 71   Ht '5\' 4"'$  (1.626 m)   Wt 162 lb (73.5 kg)   SpO2 97%   BMI 27.81 kg/m     Wt Readings from Last 3 Encounters:  06/22/22 162 lb (73.5 kg)  05/19/22 158 lb 1.6 oz (71.7 kg)  12/05/21 161 lb (73 kg)     GEN:  Well nourished, well developed in no acute distress HEENT: Normal NECK: No JVD; No carotid bruits LYMPHATICS: No lymphadenopathy CARDIAC: irregular, normal rate, no murmurs, rubs, gallops RESPIRATORY:  Clear to auscultation without rales, wheezing or rhonchi  ABDOMEN: Soft, non-tender, non-distended MUSCULOSKELETAL:  No edema; No deformity  SKIN: Warm and dry NEUROLOGIC:  Alert and oriented x 3 PSYCHIATRIC:  Normal affect   ASSESSMENT:    1. Atrial fibrillation, unspecified type ([place])   2. Chest pain of uncertain etiology   3. Essential hypertension   4. Tricuspid valve insufficiency, unspecified etiology    PLAN:    Atrial fibrillation: New diagnosis 04/2022 in setting of COVID-19 infection.  CHA2DS2-VASc score 4 (hypertension, age x2, female) -Continue Eliquis 5 mg twice daily -Continue Toprol-XL 50 mg daily -Recommend follow-up in 2 to 3 weeks, if remains in A-fib will plan for cardioversion.  She reports recent missed dose of Eliquis.  Counseled on importance of not missing any doses of Eliquis  Chest pain: Description suggest possible angina, as does report chest tightness that can occur when she is stressed.  Recent CT chest does not show coronary calcifications.  Not a candidate for coronary CTA given her A-fib.  Recommend Lexiscan Myoview to evaluate for ischemia.  Tricuspid regurgitation: Moderate to severe on echo 05/19/2022.  Will  monitor  Hypertension: Continue amlodipine 5 mg daily, lisinopril 40 mg daily, Toprol-XL 50 mg daily.  Appears controlled  RTC in 2-3 weeks   Shared Decision Making/Informed Consent The risks [chest pain, shortness of breath, cardiac arrhythmias, dizziness, blood pressure fluctuations, myocardial infarction, stroke/transient ischemic attack, nausea, vomiting, allergic reaction, radiation exposure, metallic taste sensation and life-threatening complications (estimated to be 1 in 10,000)], benefits (risk stratification, diagnosing coronary artery disease, treatment guidance) and alternatives of a nuclear stress test were discussed in detail with Ms. Hogen and she agrees to proceed.    Medication Adjustments/Labs and Tests Ordered: Current medicines are reviewed at length with the patient today.  Concerns regarding medicines are outlined above.  Orders Placed This Encounter  Procedures   MYOCARDIAL PERFUSION IMAGING   EKG 12-Lead   No orders of the defined types were placed in this encounter.   Patient Instructions  Medication Instructions:  Your physician recommends that you continue on your current medications as directed. Please refer to the Current Medication list given to you today.  *If you need a refill on your cardiac medications before your next appointment, please call your pharmacy*  Testing/Procedures: Your physician has requested that you have a lexiscan myoview. For further information please visit [email or web address]. Please follow instruction sheet, as given. This will take place at [address]  How to prepare for your Myocardial Perfusion Test: Do not eat or drink 3 hours prior to your test, except you may have water. Do not consume products containing caffeine (regular or decaffeinated) 12 hours prior to your test. (ex: coffee, chocolate, sodas, tea). Do bring a list of your current medications with you.  If not listed below, you may take your  medications as normal. Do wear comfortable clothes (no dresses or overalls) and walking shoes, tennis shoes preferred (No heels or open toe shoes are allowed). Do NOT wear cologne, perfume, aftershave, or lotions (deodorant is allowed). The test will take approximately 3 to 4 hours to complete If these instructions are not followed, your test will have to be rescheduled.  Follow-Up: At [facility], you and your health needs are our priority.  As part of our continuing mission to provide you with exceptional heart care, we have created designated Provider Care Teams.  These Care Teams include your primary Cardiologist (physician) and Advanced Practice Providers (APPs -  Physician Assistants and Nurse Practitioners) who all work together to provide you with the care you need, when you need it.  We recommend signing up for the patient portal called "MyChart".  Sign up information is provided on this After Visit Summary.  MyChart is used to connect with patients for Virtual Visits (Telemedicine).  Patients are able to view lab/test results, encounter notes, upcoming appointments, etc.  Non-urgent messages can be sent to your provider as well.   To learn more about what you can do with MyChart, go to [email or web address].    Your next appointment:   2-3 week(s)  The format for your next appointment:   In Person  Provider:   Dr. Gardiner Rhyme         Signed, Donato Heinz, MD  06/22/2022 6:06 PM    [place]

## 2022-06-22 ENCOUNTER — Encounter: Payer: Self-pay | Admitting: Cardiology

## 2022-06-22 ENCOUNTER — Ambulatory Visit: Payer: Medicare Other | Attending: Cardiology | Admitting: Cardiology

## 2022-06-22 ENCOUNTER — Telehealth: Payer: Self-pay | Admitting: Cardiology

## 2022-06-22 VITALS — BP 132/84 | HR 71 | Ht 64.0 in | Wt 162.0 lb

## 2022-06-22 DIAGNOSIS — R079 Chest pain, unspecified: Secondary | ICD-10-CM | POA: Diagnosis not present

## 2022-06-22 DIAGNOSIS — I071 Rheumatic tricuspid insufficiency: Secondary | ICD-10-CM | POA: Diagnosis not present

## 2022-06-22 DIAGNOSIS — I4891 Unspecified atrial fibrillation: Secondary | ICD-10-CM

## 2022-06-22 DIAGNOSIS — I10 Essential (primary) hypertension: Secondary | ICD-10-CM | POA: Diagnosis not present

## 2022-06-22 NOTE — Telephone Encounter (Signed)
Returned the call to the patient. She stated that she was a new patient with Dr. Gardiner Rhyme. She was calling to state that she missed a dose of Eliquis on Saturday and wanted to let Dr. Gardiner Rhyme know.

## 2022-06-22 NOTE — Patient Instructions (Signed)
Medication Instructions:  Your physician recommends that you continue on your current medications as directed. Please refer to the Current Medication list given to you today.  *If you need a refill on your cardiac medications before your next appointment, please call your pharmacy*  Testing/Procedures: Your physician has requested that you have a lexiscan myoview. For further information please visit [email or web address]. Please follow instruction sheet, as given. This will take place at [address], [address]  How to prepare for your Myocardial Perfusion Test: Do not eat or drink 3 hours prior to your test, except you may have water. Do not consume products containing caffeine (regular or decaffeinated) 12 hours prior to your test. (ex: coffee, chocolate, sodas, tea). Do bring a list of your current medications with you.  If not listed below, you may take your medications as normal. Do wear comfortable clothes (no dresses or overalls) and walking shoes, tennis shoes preferred (No heels or open toe shoes are allowed). Do NOT wear cologne, perfume, aftershave, or lotions (deodorant is allowed). The test will take approximately 3 to 4 hours to complete If these instructions are not followed, your test will have to be rescheduled.  Follow-Up: At [facility], you and your health needs are our priority.  As part of our continuing mission to provide you with exceptional heart care, we have created designated Provider Care Teams.  These Care Teams include your primary Cardiologist (physician) and Advanced Practice Providers (APPs -  Physician Assistants and Nurse Practitioners) who all work together to provide you with the care you need, when you need it.  We recommend signing up for the patient portal called "MyChart".  Sign up information is provided on this After Visit Summary.  MyChart is used to connect with patients for Virtual Visits (Telemedicine).  Patients are able to view  lab/test results, encounter notes, upcoming appointments, etc.  Non-urgent messages can be sent to your provider as well.   To learn more about what you can do with MyChart, go to [email or web address].    Your next appointment:   2-3 week(s)  The format for your next appointment:   In Person  Provider:   Dr. Gardiner Rhyme

## 2022-06-22 NOTE — Telephone Encounter (Signed)
Pt c/o medication issue:  1. Name of Medication:   apixaban (ELIQUIS) 5 MG TABS tablet    2. How are you currently taking this medication (dosage and times per day)? Take 1 tablet (5 mg total) by mouth 2 (two) times daily.  3. Are you having a reaction (difficulty breathing--STAT)? No  4. What is your medication issue? Pt would like a callback regarding medication. Please advise

## 2022-06-23 ENCOUNTER — Telehealth: Payer: Self-pay | Admitting: Cardiology

## 2022-06-23 NOTE — Telephone Encounter (Signed)
Spoke to patient advised she is scheduled to have a Lexiscan on Friday 11/16 at 10:45 am.Advised when she comes back to see Dr.Schumann 11/16 at 3:40 pm he will then discuss a possible cardioversion.

## 2022-06-23 NOTE — Telephone Encounter (Signed)
Patient calling with questions/concerns bout her procedure on Friday. Please advise

## 2022-06-25 ENCOUNTER — Telehealth (HOSPITAL_COMMUNITY): Payer: Self-pay | Admitting: *Deleted

## 2022-06-25 NOTE — Telephone Encounter (Signed)
Left message on voicemail per DPR in reference to upcoming appointment scheduled on 07/01/2022 at 10:45 with detailed instructions given per Myocardial Perfusion Study Information Sheet for the test. LM to arrive 15 minutes early, and that it is imperative to arrive on time for appointment to keep from having the test rescheduled. If you need to cancel or reschedule your appointment, please call the office within 24 hours of your appointment. Failure to do so may result in a cancellation of your appointment, and a $50 no show fee. Phone number given for call back for any questions.

## 2022-06-26 ENCOUNTER — Encounter (HOSPITAL_COMMUNITY): Payer: Medicare Other

## 2022-06-29 DIAGNOSIS — R2689 Other abnormalities of gait and mobility: Secondary | ICD-10-CM | POA: Diagnosis not present

## 2022-07-01 ENCOUNTER — Ambulatory Visit (HOSPITAL_COMMUNITY): Payer: Medicare Other | Attending: Cardiology

## 2022-07-01 DIAGNOSIS — R079 Chest pain, unspecified: Secondary | ICD-10-CM

## 2022-07-01 LAB — MYOCARDIAL PERFUSION IMAGING
LV dias vol: 60 mL (ref 46–106)
LV sys vol: 25 mL
Nuc Stress EF: 59 %
Peak HR: 91 {beats}/min
Rest HR: 80 {beats}/min
Rest Nuclear Isotope Dose: 10.9 mCi
SDS: 6
SRS: 0
SSS: 6
ST Depression (mm): 0 mm
Stress Nuclear Isotope Dose: 31.1 mCi
TID: 1.13

## 2022-07-01 MED ORDER — TECHNETIUM TC 99M TETROFOSMIN IV KIT
31.1000 | PACK | Freq: Once | INTRAVENOUS | Status: AC | PRN
  Administered 2022-07-01: 31.1 via INTRAVENOUS

## 2022-07-01 MED ORDER — REGADENOSON 0.4 MG/5ML IV SOLN
0.4000 mg | Freq: Once | INTRAVENOUS | Status: AC
  Administered 2022-07-01: 0.4 mg via INTRAVENOUS

## 2022-07-01 MED ORDER — TECHNETIUM TC 99M TETROFOSMIN IV KIT
10.9000 | PACK | Freq: Once | INTRAVENOUS | Status: AC | PRN
  Administered 2022-07-01: 10.9 via INTRAVENOUS

## 2022-07-03 DIAGNOSIS — R2689 Other abnormalities of gait and mobility: Secondary | ICD-10-CM | POA: Diagnosis not present

## 2022-07-07 NOTE — Progress Notes (Unsigned)
Cardiology Office Note:    Date:  07/09/2022   ID:  Amanda Sims, DOB [birth year], MRN [id number]  PCP:  Marda Stalker, PA-C  Cardiologist:  None  Electrophysiologist:  None   Referring MD: Marda Stalker, PA-C   Chief Complaint  Patient presents with   Atrial Fibrillation    History of Present Illness:    Amanda Sims is a 81 y.o. female with a hx of bronchiectasis, MAI who presents for follow-up.  She was referred by Marda Stalker, PA for evaluation of atrial fibrillation, initially seen 06/15/2022.  She was admitted 04/2022 with fatigue and cough, found to have COVID-19 infection.  While in ED, noted to be in A-fib with RVR.  She was started on Eliquis and initially diltiazem drip, switch to p.o. metoprolol on discharge.    She denies having palpitations.  Reports has had couple episodes of chest tightness over the last few months.  Describes as tightness in center of her chest when stressed.  She has not been exercising.  Does report some shortness of breath when she exerts herself.  Also having some lightheadedness, denies any syncope.  Does report having some lower extremity edema, worsened when her amlodipine was increased to 10 mg daily but has improved since back to 5 mg daily.  She is taking Eliquis, denies any bleeding issues.  Missed doses of Eliquis over the weekend.  No family history of heart disease.  Echocardiogram 05/19/2022 showed EF 50 to 55%, mild LVH, normal RV function, RVSP 55, severe biatrial enlargement, moderate to severe TR. Lexiscan Myoview on 07/01/2022 showed small area of ischemia in apical anterior wall (on my review appears normal perfusion), EF 59%.  Since last clinic visit, she reports she has been feeling fatigued and short of breath.  Reports occasional chest pain.  Denies any lightheadedness or syncope.  Reports has been having mild lower extremity edema.  She is taking Eliquis, denies any bleeding issues.  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   amLODipine (NORVASC) 5 MG tablet Take 5 mg by mouth daily.   apixaban (ELIQUIS) 5 MG TABS tablet Take 1 tablet (5 mg total) by mouth 2 (two) times daily.   CALCIUM PO Take 1 tablet by mouth daily.   cetirizine (ZYRTEC) 10 MG tablet Take 10 mg by mouth daily.   Cholecalciferol (VITAMIN D-3 PO) Take 2 tablets by mouth daily.   DULoxetine (CYMBALTA) 60 MG capsule Take 1 capsule by mouth daily.   furosemide (LASIX) 20 MG tablet Take 1 tablet (20 mg total) by mouth as needed.   lisinopril (PRINIVIL,ZESTRIL) 40 MG tablet Take 1 tablet by mouth daily.   metoprolol succinate (TOPROL-XL) 50 MG 24 hr tablet Take 1 tablet (50 mg total) by mouth daily.     Allergies:   Patient has no known allergies.   Social History   Socioeconomic History   Marital status:  Married    Spouse name: Not on file   Number of children: Not on file   Years of education: Not on file   Highest education level: Not on file  Occupational History   Not on file  Tobacco Use   Smoking status: Never   Smokeless tobacco: Never  Vaping Use   Vaping Use: Never used  Substance and Sexual Activity   Alcohol use: Yes   Drug use: No   Sexual activity: Not on file  Other Topics Concern   Not on file  Social History Narrative   Not on file   Social Determinants of Health   Financial Resource Strain: Not on file  Food Insecurity: No Food Insecurity (05/19/2022)   Hunger Vital Sign    Worried  About Running Out of Food in the Last Year: Never true    Ran Out of Food in the Last Year: Never true  Transportation Needs: No Transportation Needs (05/19/2022)   PRAPARE - Hydrologist (Medical): No    Lack of Transportation (Non-Medical): No  Physical Activity: Not on file  Stress: Not on file  Social Connections: Not on file     Family History: No family history of heart disease.    ROS:   Please see the history of present illness.     All other systems reviewed and are negative.  EKGs/Labs/Other Studies Reviewed:    The following studies were reviewed today:   EKG:   07/09/22: A-fib, rate 75 06/22/22: Afib, rate 71  Recent Labs: 05/19/2022: TSH 0.999 05/20/2022: ALT 31; B Natriuretic Peptide 248.2; BUN 11; Creatinine, Ser 0.57; Hemoglobin 13.9; Magnesium 1.9; Platelets 96; Potassium 4.6; Sodium 133  Recent Lipid Panel No results found for: "CHOL", "TRIG", "HDL", "CHOLHDL", "VLDL", "LDLCALC", "LDLDIRECT"  Physical Exam:    VS:  BP 126/82   Pulse 75   Ht '5\' 4"'$  (1.626 m)   Wt 164 lb 12.8 oz (74.8 kg)   SpO2 91%   BMI 28.29 kg/m     Wt Readings from Last 3 Encounters:  07/09/22 164 lb 12.8 oz (74.8 kg)  06/22/22 162 lb (73.5 kg)  05/19/22 158 lb 1.6 oz (71.7 kg)     GEN:  Well nourished, well developed in no acute distress HEENT: Normal NECK: + JVD; No carotid bruits CARDIAC: irregular, normal rate, no murmurs, rubs, gallops RESPIRATORY:  Clear to auscultation without rales, wheezing or rhonchi  ABDOMEN: Soft, non-tender, non-distended MUSCULOSKELETAL:  Trace edema; No deformity  SKIN: Warm and dry NEUROLOGIC:  Alert and oriented x 3 PSYCHIATRIC:  Normal affect   ASSESSMENT:    1. Persistent atrial fibrillation ([place])   2. Acute on chronic diastolic heart failure ([place])   3. Essential hypertension   4. Tricuspid valve insufficiency, unspecified etiology     PLAN:    Atrial fibrillation: New diagnosis 04/2022 in setting of  COVID-19 infection.  CHA2DS2-VASc score 4 (hypertension, age x2, female) -Continue Eliquis 5 mg twice daily -Continue Toprol-XL 50 mg daily -She remains in A-fib, denies any missed doses of Eliquis, will schedule cardioversion  Acute on chronic diastolic heart failure: Echocardiogram 05/19/2022 showed EF 50 to 55%, mild LVH, normal RV function, RVSP 55, severe biatrial enlargement, moderate to severe TR. Mildly hypervolemic on exam today -Recommend starting Lasix 20 mg daily x3 days then monitor daily weights and take if gains more than 3 pounds in 1 day or 5 pounds in 1 week  Chest pain: Description suggest possible angina,  as does report chest tightness that can occur when she is stressed.  Recent CT chest does not show coronary calcifications.  Not a candidate for coronary CTA given her A-fib.  Lexiscan Myoview on 07/01/2022 showed small area of ischemia in apical anterior wall (on my review appears normal perfusion), EF 59%.  Tricuspid regurgitation: Moderate to severe on echo 05/19/2022.  Will monitor  Hypertension: Continue amlodipine 5 mg daily, lisinopril 40 mg daily, Toprol-XL 50 mg daily.  Appears controlled  RTC in 6 weeks     Medication Adjustments/Labs and Tests Ordered: Current medicines are reviewed at length with the patient today.  Concerns regarding medicines are outlined above.  Orders Placed This Encounter  Procedures   Basic metabolic panel   CBC   EKG 12-Lead   Meds ordered this encounter  Medications   furosemide (LASIX) 20 MG tablet    Sig: Take 1 tablet (20 mg total) by mouth as needed.    Dispense:  30 tablet    Refill:  1    Patient Instructions  Medication Instructions:  Take furosemide (Lasix) 20 mg daily for 3 days  Weigh yourself daily and call if you gain 3 lbs overnight or 5 lbs in 1 week  *If you need a refill on your cardiac medications before your next appointment, please call your pharmacy*   Lab Work: Please return for labs Wednesday  11/22 or Monday11/27 (BMET, CBC)  Our in office lab hours are Monday-Friday 8:00-4:00, closed for lunch 12:45-1:45 pm.  No appointment needed.  [facility] locations:   [place] ([place]) - [address] [place] [address] - [address] [place] - [address] Oncologist)  Testing/Procedures: Your physician has recommended that you have a Cardioversion (DCCV). Electrical Cardioversion uses a jolt of electricity to your heart either through paddles or wired patches attached to your chest. This is a controlled, usually prescheduled, procedure. Defibrillation is done under light anesthesia in the hospital, and you usually go home the day of the procedure. This is done to get your heart back into a normal rhythm. You are not awake for the procedure. Please see the instruction sheet given to you today.  Follow-Up: At [facility], you and your health needs are our priority.  As part of our continuing mission to provide you with exceptional heart care, we have created designated Provider Care Teams.  These Care Teams include your primary Cardiologist (physician) and Advanced Practice Providers (APPs -  Physician Assistants and Nurse Practitioners) who all work together to provide you with the care you need, when you need it.  We recommend signing up for the patient portal called "MyChart".  Sign up information is provided on this After Visit Summary.  MyChart is used to connect with patients for Virtual Visits (Telemedicine).  Patients are able to view lab/test results, encounter notes, upcoming appointments, etc.  Non-urgent messages can be sent to your provider as well.   To learn more about what you can  do with MyChart, go to [email or web address].    Your next appointment:   6 week(s)  The format for your next appointment:  In Person  Provider:   Dr. Gardiner Rhyme  Other Instructions     Dear [name]  You are scheduled for a Cardioversion on Wednesday, November 29 with Dr. Stanford Breed.  Please arrive at the [facility] (Main Entrance A) at [facility]: [address] [place], [place] [address] at 11:30 AM.   DIET:  Nothing to eat or drink after midnight except a sip of water with medications (see medication instructions below)  MEDICATION INSTRUCTIONS: Hold Lasix AM of procedure Continue taking your anticoagulant (blood thinner): Apixaban (Eliquis).  You will need to continue this after your procedure until you are told by your provider that it is safe to stop.    LABS: Return to the office for labs on Wedneday 11/22 or Monday 11/27  FYI:  For your safety, and to allow [place] to monitor your vital signs accurately during the surgery/procedure we request: If you have artificial nails, gel coating, SNS etc, please have those removed prior to your surgery/procedure. Not having the nail coverings /polish removed may result in cancellation or delay of your surgery/procedure.  You must have a responsible person to drive you home and stay in the waiting area during your procedure. Failure to do so could result in cancellation.  Bring your insurance cards.  *Special Note: Every effort is made to have your procedure done on time. Occasionally there are emergencies that occur at the hospital that may cause delays. Please be patient if a delay does occur.             Signed, Donato Heinz, MD  07/09/2022 6:01 PM    [facility]

## 2022-07-09 ENCOUNTER — Ambulatory Visit: Payer: Medicare Other | Attending: Cardiology | Admitting: Cardiology

## 2022-07-09 ENCOUNTER — Encounter: Payer: Self-pay | Admitting: Cardiology

## 2022-07-09 VITALS — BP 126/82 | HR 75 | Ht 64.0 in | Wt 164.8 lb

## 2022-07-09 DIAGNOSIS — I4819 Other persistent atrial fibrillation: Secondary | ICD-10-CM

## 2022-07-09 DIAGNOSIS — I071 Rheumatic tricuspid insufficiency: Secondary | ICD-10-CM

## 2022-07-09 DIAGNOSIS — I5033 Acute on chronic diastolic (congestive) heart failure: Secondary | ICD-10-CM

## 2022-07-09 DIAGNOSIS — I10 Essential (primary) hypertension: Secondary | ICD-10-CM | POA: Diagnosis not present

## 2022-07-09 MED ORDER — FUROSEMIDE 20 MG PO TABS: 20.0000 mg | ORAL_TABLET | ORAL | 1 refills | Status: DC | PRN

## 2022-07-09 NOTE — Patient Instructions (Signed)
Medication Instructions:  Take furosemide (Lasix) 20 mg daily for 3 days  Weigh yourself daily and call if you gain 3 lbs overnight or 5 lbs in 1 week  *If you need a refill on your cardiac medications before your next appointment, please call your pharmacy*   Lab Work: Please return for labs Wednesday 11/22 or Monday11/27 (BMET, CBC)  Our in office lab hours are Monday-Friday 8:00-4:00, closed for lunch 12:45-1:45 pm.  No appointment needed.  [facility] locations:   [place] ([place]) - [address] [place] [address] - [address] [place] - [address] Oncologist)  Testing/Procedures: Your physician has recommended that you have a Cardioversion (DCCV). Electrical Cardioversion uses a jolt of electricity to your heart either through paddles or wired patches attached to your chest. This is a controlled, usually prescheduled, procedure. Defibrillation is done under light anesthesia in the hospital, and you usually go home the day of the procedure. This is done to get your heart back into a normal rhythm. You are not awake for the procedure. Please see the instruction sheet given to you today.  Follow-Up: At [facility], you and your health needs are our priority.  As part of our continuing mission to provide you with exceptional heart care, we have created designated Provider Care Teams.  These Care Teams include your primary Cardiologist (physician) and Advanced Practice Providers (APPs -  Physician Assistants and Nurse Practitioners) who all work together to provide you with the care you need, when you need it.  We recommend signing up for the patient portal called "MyChart".  Sign up  information is provided on this After Visit Summary.  MyChart is used to connect with patients for Virtual Visits (Telemedicine).  Patients are able to view lab/test results, encounter notes, upcoming appointments, etc.  Non-urgent messages can be sent to your provider as well.   To learn more about what you can do with MyChart, go to [email or web address].    Your next appointment:   6 week(s)  The format for your next appointment:   In Person  Provider:   Dr. Gardiner Rhyme  Other Instructions     Dear Amanda Sims  You are scheduled for a Cardioversion on Wednesday, November 29 with Dr. Stanford Breed.  Please arrive at the [facility] (Main Entrance A) at [facility]: [address] [place], [place] [address] at 11:30 AM.   DIET:  Nothing to eat or drink after midnight except a sip of water with medications (see medication instructions below)  MEDICATION INSTRUCTIONS: Hold Lasix AM of procedure Continue taking your anticoagulant (blood thinner): Apixaban (Eliquis).  You will need to continue this after your procedure until you are told by your provider that it is safe to stop.    LABS: Return to the office for labs on Wedneday 11/22 or Monday 11/27  FYI:  For your safety, and to allow [place] to monitor your vital signs accurately during the surgery/procedure we request: If you have artificial nails, gel coating, SNS etc, please have those removed prior to your surgery/procedure. Not having the nail coverings /polish removed may result in  cancellation or delay of your surgery/procedure.  You must have a responsible person to drive you home and stay in the waiting area during your procedure. Failure to do so could result in cancellation.  Bring your insurance cards.  *Special Note: Every effort is made to have your procedure done on time. Occasionally there are emergencies that occur at the hospital that may cause delays. Please be patient if a delay does occur.

## 2022-07-13 ENCOUNTER — Telehealth: Payer: Self-pay | Admitting: Cardiology

## 2022-07-13 MED ORDER — FUROSEMIDE 20 MG PO TABS: ORAL_TABLET | ORAL | 3 refills | Status: DC

## 2022-07-13 NOTE — Telephone Encounter (Signed)
Pt c/o medication issue:  1. Name of Medication: furosemide (LASIX) 20 MG tablet   2. How are you currently taking this medication (dosage and times per day)? Not currently taking   3. Are you having a reaction (difficulty breathing--STAT)? No   4. What is your medication issue? Patient is calling stating this prescription went to the wrong [facility]. She is wanting it to go to [facility] and she also reports her insurance is not willing to cover the prescription as written due to it being "as needed" she is requesting it be rewritten prior to being sent. She would also like a callback on when to start medication once sent due to being concerned with frequent urination with thanksgiving.

## 2022-07-13 NOTE — Telephone Encounter (Signed)
Spoke to patient she stated she needs Lasix refill sent to her pharmacy.Stated the way prescription wrote pharmacy cannot accept 20 mg as needed.Advised I will correct prescription to 20 mg daily as needed.She will take like Dr.Schumann recommended 20 mg daily for 3 days then 20 mg daily as needed.

## 2022-07-15 DIAGNOSIS — I5033 Acute on chronic diastolic (congestive) heart failure: Secondary | ICD-10-CM | POA: Diagnosis not present

## 2022-07-15 DIAGNOSIS — I10 Essential (primary) hypertension: Secondary | ICD-10-CM | POA: Diagnosis not present

## 2022-07-15 DIAGNOSIS — I4819 Other persistent atrial fibrillation: Secondary | ICD-10-CM | POA: Diagnosis not present

## 2022-07-16 LAB — BASIC METABOLIC PANEL
BUN/Creatinine Ratio: 18 (ref 12–28)
BUN: 13 mg/dL (ref 8–27)
CO2: 25 mmol/L (ref 20–29)
Calcium: 9.1 mg/dL (ref 8.7–10.3)
Chloride: 98 mmol/L (ref 96–106)
Creatinine, Ser: 0.73 mg/dL (ref 0.57–1.00)
Glucose: 73 mg/dL (ref 70–99)
Potassium: 4.3 mmol/L (ref 3.5–5.2)
Sodium: 142 mmol/L (ref 134–144)
eGFR: 84 mL/min/{1.73_m2} (ref >59)

## 2022-07-16 LAB — CBC
Hematocrit: 43.7 % (ref 34.0–46.6)
Hemoglobin: 14.6 g/dL (ref 11.1–15.9)
MCH: 33 pg (ref 26.6–33.0)
MCHC: 33.4 g/dL (ref 31.5–35.7)
MCV: 99 fL — ABNORMAL HIGH (ref 79–97)
Platelets: 140 10*3/uL — ABNORMAL LOW (ref 150–450)
RBC: 4.43 x10E6/uL (ref 3.77–5.28)
RDW: 12.3 % (ref 11.7–15.4)
WBC: 6.4 10*3/uL (ref 3.4–10.8)

## 2022-07-22 ENCOUNTER — Ambulatory Visit (HOSPITAL_BASED_OUTPATIENT_CLINIC_OR_DEPARTMENT_OTHER): Payer: Medicare Other | Admitting: Anesthesiology

## 2022-07-22 ENCOUNTER — Encounter (HOSPITAL_COMMUNITY)
Admission: RE | Disposition: A | Discharge status: Home / Self Care | Payer: Self-pay | Source: Home / Self Care | Attending: Cardiology

## 2022-07-22 ENCOUNTER — Ambulatory Visit (HOSPITAL_COMMUNITY): Payer: Medicare Other | Admitting: Anesthesiology

## 2022-07-22 ENCOUNTER — Ambulatory Visit (HOSPITAL_COMMUNITY)
Admission: RE | Admit: 2022-07-22 | Discharge: 2022-07-22 | Disposition: A | Discharge status: Home / Self Care | Payer: Medicare Other | Attending: Cardiology | Admitting: Cardiology

## 2022-07-22 ENCOUNTER — Encounter (HOSPITAL_COMMUNITY): Payer: Self-pay | Admitting: Cardiology

## 2022-07-22 ENCOUNTER — Other Ambulatory Visit: Payer: Self-pay

## 2022-07-22 DIAGNOSIS — M8580 Other specified disorders of bone density and structure, unspecified site: Secondary | ICD-10-CM

## 2022-07-22 DIAGNOSIS — I4891 Unspecified atrial fibrillation: Secondary | ICD-10-CM | POA: Insufficient documentation

## 2022-07-22 DIAGNOSIS — Z6828 Body mass index (BMI) 28.0-28.9, adult: Secondary | ICD-10-CM | POA: Diagnosis not present

## 2022-07-22 DIAGNOSIS — I081 Rheumatic disorders of both mitral and tricuspid valves: Secondary | ICD-10-CM

## 2022-07-22 DIAGNOSIS — I119 Hypertensive heart disease without heart failure: Secondary | ICD-10-CM

## 2022-07-22 DIAGNOSIS — I4819 Other persistent atrial fibrillation: Secondary | ICD-10-CM | POA: Diagnosis not present

## 2022-07-22 DIAGNOSIS — E669 Obesity, unspecified: Secondary | ICD-10-CM | POA: Diagnosis not present

## 2022-07-22 HISTORY — PX: CARDIOVERSION: SHX1299

## 2022-07-22 SURGERY — CARDIOVERSION
Anesthesia: General

## 2022-07-22 MED ORDER — PROPOFOL 10 MG/ML IV BOLUS
INTRAVENOUS | Status: DC | PRN
  Administered 2022-07-22: 60 mg via INTRAVENOUS
  Administered 2022-07-22: 20 mg via INTRAVENOUS

## 2022-07-22 MED ORDER — LIDOCAINE 2% (20 MG/ML) 5 ML SYRINGE
INTRAMUSCULAR | Status: DC | PRN
  Administered 2022-07-22: 60 mg via INTRAVENOUS

## 2022-07-22 MED ORDER — SODIUM CHLORIDE 0.9 % IV SOLN: INTRAVENOUS | Status: DC

## 2022-07-22 NOTE — Transfer of Care (Signed)
Immediate Anesthesia Transfer of Care Note  Patient: Amanda Sims  Procedure(s) Performed: CARDIOVERSION  Patient Location: Endoscopy Unit  Anesthesia Type:General  Level of Consciousness: drowsy  Airway & Oxygen Therapy: Patient Spontanous Breathing  Post-op Assessment: Report given to RN and Post -op Vital signs reviewed and stable  Post vital signs: Reviewed and stable  Last Vitals:  Vitals Value Taken Time  BP 130/68 (85)   Temp    Pulse 63   Resp 20   SpO2 94     Last Pain:  Vitals:   07/22/22 1153  PainSc: 0-No pain      Patients Stated Pain Goal: 3 (77/82/42 [id number])  Complications: No notable events documented.

## 2022-07-22 NOTE — Anesthesia Procedure Notes (Signed)
Procedure Name: General with mask airway Date/Time: 07/22/2022 12:14 PM  Performed by: Dorann Lodge, CRNAPre-anesthesia Checklist: Emergency Drugs available, Patient identified, Suction available and Patient being monitored Patient Re-evaluated:Patient Re-evaluated prior to induction Oxygen Delivery Method: Ambu bag Preoxygenation: Pre-oxygenation with 100% oxygen Induction Type: IV induction Dental Injury: Teeth and Oropharynx as per pre-operative assessment

## 2022-07-22 NOTE — H&P (Signed)
Office Visit 07/09/2022 [facility] [facility] Of [place]. [facility] Markus Daft, MD Cardiology Persistent atrial fibrillation [facility]) +3 more Dx Atrial Fibrillation; Referred by Marda Stalker, PA-C Reason for Visit   Additional Documentation  Vitals: BP 126/82   Pulse 75   Ht '5\' 4"'$  (1.626 m)   Wt 74.8 kg   SpO2 91%   BMI 28.29 kg/m   BSA 1.84 m  Flowsheets: NEWS,   MEWS Score,   Anthropometrics  Encounter Info: Billing Info,   History,   Allergies,   Detailed Report   All Notes   Progress Notes by Donato Heinz, MD at 07/09/2022 3:40 PM  Author: Donato Heinz, MD Author Type: Physician Filed: 07/09/2022  6:01 PM  Note Status: Signed Cosign: Cosign Not Required Encounter Date: 07/09/2022  Editor: Donato Heinz, MD (Physician)             Expand All Collapse All  Cardiology Office Note:     Date:  07/09/2022    ID:  Amanda Sims, DOB 1941/05/04, MRN [id number]   PCP:  Marda Stalker, PA-C         Cardiologist:  None  Electrophysiologist:  None    Referring MD: Marda Stalker, PA-C       Chief Complaint  Patient presents with   Atrial Fibrillation      History of Present Illness:     Amanda Sims is a 81 y.o. female with a hx of bronchiectasis, MAI who presents for follow-up.  She was referred by Marda Stalker, PA for evaluation of atrial fibrillation, initially seen 06/15/2022.  She was admitted 04/2022 with fatigue and cough, found to have COVID-19 infection.  While in ED, noted to be in A-fib with RVR.  She was started on Eliquis and initially diltiazem drip, switch to p.o. metoprolol on discharge.     She denies having palpitations.  Reports has had couple episodes of chest tightness over the last few months.  Describes as tightness in center of her chest when stressed.  She has not been exercising.  Does report some shortness of breath when she exerts  herself.  Also having some lightheadedness, denies any syncope.  Does report having some lower extremity edema, worsened when her amlodipine was increased to 10 mg daily but has improved since back to 5 mg daily.  She is taking Eliquis, denies any bleeding issues.  Missed doses of Eliquis over the weekend.  No family history of heart disease.   Echocardiogram 05/19/2022 showed EF 50 to 55%, mild LVH, normal RV function, RVSP 55, severe biatrial enlargement, moderate to severe TR. Lexiscan Myoview on 07/01/2022 showed small area of ischemia in apical anterior wall (on my review appears normal perfusion), EF 59%.   Since last clinic visit, she reports she has been feeling fatigued and short of breath.  Reports occasional chest pain.  Denies any lightheadedness or syncope.  Reports has been having mild lower extremity edema.  She is taking Eliquis, denies any bleeding issues.      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   amLODipine (NORVASC) 5 MG tablet Take 5 mg by mouth daily.   apixaban (ELIQUIS) 5 MG TABS tablet Take 1 tablet (5 mg total) by mouth 2 (two) times daily.   CALCIUM PO Take 1 tablet by mouth daily.   cetirizine (ZYRTEC) 10 MG tablet Take 10 mg by mouth daily.   Cholecalciferol (VITAMIN D-3 PO) Take 2 tablets by mouth daily.   DULoxetine (CYMBALTA) 60 MG capsule Take 1 capsule by mouth daily.   furosemide (LASIX) 20 MG tablet  Take 1 tablet (20 mg total) by mouth as needed.   lisinopril (PRINIVIL,ZESTRIL) 40 MG tablet Take 1 tablet by mouth daily.   metoprolol succinate (TOPROL-XL) 50 MG 24 hr tablet Take 1 tablet (50 mg total) by mouth daily.        Allergies:   Patient has no known allergies.    Social History         Socioeconomic History   Marital status: Married      Spouse name: Not on file   Number of children: Not on file   Years of education: Not on file   Highest education level: Not on file  Occupational History   Not on file  Tobacco Use   Smoking status: Never   Smokeless tobacco: Never  Vaping Use   Vaping Use: Never used  Substance and Sexual Activity   Alcohol use: Yes   Drug use: No   Sexual activity: Not on file  Other Topics Concern   Not on file  Social History Narrative   Not on file    Social Determinants of Health        Financial Resource Strain: Not on file  Food Insecurity: No Food Insecurity (05/19/2022)    Hunger Vital Sign     Worried About Running Out of Food in the Last Year: Never true     Ran Out of Food in the Last Year: Never true  Transportation Needs: No Transportation Needs (05/19/2022)    PRAPARE - Armed forces logistics/support/administrative officer (Medical): No     Lack of Transportation (Non-Medical): No  Physical Activity: Not on file  Stress: Not on file  Social Connections: Not on file      Family History: No family history of heart disease.     ROS:   Please see the history of present illness.     All other systems reviewed and are negative.   EKGs/Labs/Other Studies Reviewed:     The following studies were reviewed today:     EKG:   07/09/22: A-fib, rate 75 06/22/22: Afib, rate 71   Recent Labs: 05/19/2022: TSH 0.999 05/20/2022: ALT 31; B Natriuretic Peptide 248.2; BUN 11; Creatinine, Ser 0.57; Hemoglobin 13.9; Magnesium 1.9; Platelets 96; Potassium 4.6; Sodium 133  Recent Lipid Panel Labs (Brief)  No results found for: "CHOL", "TRIG",  "HDL", "CHOLHDL", "VLDL", "LDLCALC", "LDLDIRECT"     Physical Exam:     VS:  BP 126/82   Pulse 75   Ht '5\' 4"'$  (1.626 m)   Wt 164 lb 12.8 oz (74.8 kg)   SpO2 91%   BMI 28.29 kg/m  Wt Readings from Last 3 Encounters:  07/09/22 164 lb 12.8 oz (74.8 kg)  06/22/22 162 lb (73.5 kg)  05/19/22 158 lb 1.6 oz (71.7 kg)      GEN:  Well nourished, well developed in no acute distress HEENT: Normal NECK: + JVD; No carotid bruits CARDIAC: irregular, normal rate, no murmurs, rubs, gallops RESPIRATORY:  Clear to auscultation without rales, wheezing or rhonchi  ABDOMEN: Soft, non-tender, non-distended MUSCULOSKELETAL:  Trace edema; No deformity  SKIN: Warm and dry NEUROLOGIC:  Alert and oriented x 3 PSYCHIATRIC:  Normal affect    ASSESSMENT:     1. Persistent atrial fibrillation ([place])   2. Acute on chronic diastolic heart failure ([place])   3. Essential hypertension   4. Tricuspid valve insufficiency, unspecified etiology       PLAN:     Atrial fibrillation: New diagnosis 04/2022 in setting of COVID-19 infection.  CHA2DS2-VASc score 4 (hypertension, age x2, female) -Continue Eliquis 5 mg twice daily -Continue Toprol-XL 50 mg daily -She remains in A-fib, denies any missed doses of Eliquis, will schedule cardioversion   Acute on chronic diastolic heart failure: Echocardiogram 05/19/2022 showed EF 50 to 55%, mild LVH, normal RV function, RVSP 55, severe biatrial enlargement, moderate to severe TR. Mildly hypervolemic on exam today -Recommend starting Lasix 20 mg daily x3 days then monitor daily weights and take if gains more than 3 pounds in 1 day or 5 pounds in 1 week   Chest pain: Description suggest possible angina, as does report chest tightness that can occur when she is stressed.  Recent CT chest does not show coronary calcifications.  Not a candidate for coronary CTA given her A-fib.  Lexiscan Myoview on 07/01/2022 showed small area of ischemia in apical anterior wall (on my review  appears normal perfusion), EF 59%.   Tricuspid regurgitation: Moderate to severe on echo 05/19/2022.  Will monitor   Hypertension: Continue amlodipine 5 mg daily, lisinopril 40 mg daily, Toprol-XL 50 mg daily.  Appears controlled   RTC in 6 weeks         Medication Adjustments/Labs and Tests Ordered: Current medicines are reviewed at length with the patient today.  Concerns regarding medicines are outlined above.     Orders Placed This Encounter  Procedures   Basic metabolic panel   CBC   EKG 12-Lead        Meds ordered this encounter  Medications   furosemide (LASIX) 20 MG tablet      Sig: Take 1 tablet (20 mg total) by mouth as needed.      Dispense:  30 tablet      Refill:  1      Patient Instructions  Medication Instructions:  Take furosemide (Lasix) 20 mg daily for 3 days   Weigh yourself daily and call if you gain 3 lbs overnight or 5 lbs in 1 week   *If you need a refill on your cardiac medications before your next appointment, please call your pharmacy*     Lab Work: Please return for labs Wednesday 11/22 or Monday11/27 (BMET, CBC)   Our in office lab hours are Monday-Friday 8:00-4:00, closed for lunch 12:45-1:45 pm.  No appointment needed.   [facility] locations:   [place] - [address] [place] [address]    [place]  [address]    [place] - [address] - [address] [place] - [address] Oncologist)   Testing/Procedures: Your physician has recommended that you have a Cardioversion (DCCV). Electrical Cardioversion uses a jolt of electricity to your heart either through paddles or wired patches attached to your chest. This is a controlled, usually prescheduled, procedure. Defibrillation is done under  light anesthesia in the hospital, and you usually go home the day of the procedure. This is done to get your heart back into a normal rhythm. You are not awake for the procedure. Please see the instruction sheet given to you today.   Follow-Up: At [facility], you and your health needs are our priority.  As part of our continuing mission to provide you with exceptional heart care, we have created designated Provider Care Teams.  These Care Teams include your primary Cardiologist (physician) and Advanced Practice Providers (APPs -  Physician Assistants and Nurse Practitioners) who all work together to provide you with the care you need, when you need it.   We recommend signing up for the patient portal called "MyChart".  Sign up information is provided on this After Visit Summary.  MyChart is used to connect with patients for Virtual Visits (Telemedicine).  Patients are able to view lab/test results, encounter notes, upcoming appointments, etc.  Non-urgent messages can be sent to your provider as well.   To learn more about what you can do with MyChart, go to [email or web address].     Your next appointment:   6 week(s)   The format for your next appointment:   In Person   Provider:   Dr. Gardiner Rhyme   Other Instructions     Dear Amanda Sims  You are scheduled for a Cardioversion on Wednesday, November 29 with Dr. Stanford Breed.  Please arrive at the [facility] (Main Entrance A) at [facility]: [address] [place], [place] [address] at 11:30 AM.    DIET:  Nothing to eat or drink after midnight except a sip of water with medications (see medication instructions below)   MEDICATION INSTRUCTIONS: Hold Lasix AM of procedure Continue taking your anticoagulant (blood thinner): Apixaban (Eliquis).  You will need to continue this after your procedure until you are told by your provider that it is safe to stop.     LABS: Return to the office for labs on Wedneday 11/22 or  Monday 11/27   FYI:  For your safety, and to allow [place] to monitor your vital signs accurately during the surgery/procedure we request: If you have artificial nails, gel coating, SNS etc, please have those removed prior to your surgery/procedure. Not having the nail coverings /polish removed may result in cancellation or delay of your surgery/procedure.   You must have a responsible person to drive you home and stay in the waiting area during your procedure. Failure to do so could result in cancellation.   Bring your insurance cards.   *Special Note: Every effort is made to have your procedure done on time. Occasionally there are emergencies that occur at the hospital that may cause delays. Please be patient if a delay does occur.                      Signed, Donato Heinz, MD  07/09/2022 6:01 PM    [facility]  For DCCV; no changes; compliant with apixaban. [name]

## 2022-07-22 NOTE — Interval H&P Note (Signed)
History and Physical Interval Note:  07/22/2022 11:55 AM  Amanda Sims  has presented today for surgery, with the diagnosis of AFIB.  The various methods of treatment have been discussed with the patient and family. After consideration of risks, benefits and other options for treatment, the patient has consented to  Procedure(s): CARDIOVERSION (N/A) as a surgical intervention.  The patient's history has been reviewed, patient examined, no change in status, stable for surgery.  I have reviewed the patient's chart and labs.  Questions were answered to the patient's satisfaction.     [name]

## 2022-07-22 NOTE — Procedures (Signed)
Electrical Cardioversion Procedure Note Amanda Sims [id number] 1940-12-16  Procedure: Electrical Cardioversion Indications:  Atrial Fibrillation  Procedure Details Consent: Risks of procedure as well as the alternatives and risks of each were explained to the (patient/caregiver).  Consent for procedure obtained. Time Out: Verified patient identification, verified procedure, site/side was marked, verified correct patient position, special equipment/implants available, medications/allergies/relevent history reviewed, required imaging and test results available.  Performed  Patient placed on cardiac monitor, pulse oximetry, supplemental oxygen as necessary.  Sedation given:  Pt sedated by anesthesia with lidocaine  60 mg and diprovan 80 mg IV. Pacer pads placed anterior and posterior chest.  Cardioverted 1 time(s).  Cardioverted at [place].  Evaluation Findings: Post procedure EKG shows: NSR Complications: None Patient did tolerate procedure well.   Amanda Sims 07/22/2022, 11:50 AM

## 2022-07-22 NOTE — Discharge Instructions (Signed)

## 2022-07-22 NOTE — Anesthesia Preprocedure Evaluation (Addendum)
Anesthesia Evaluation  Patient identified by MRN, date of birth, ID band Patient awake    Reviewed: Allergy & Precautions, NPO status , Patient's Chart, lab work & pertinent test results  History of Anesthesia Complications Negative for: history of anesthetic complications  Airway Mallampati: III  TM Distance: >3 FB Neck ROM: Full    Dental  (+)    Pulmonary neg shortness of breath, neg sleep apnea, neg COPD, neg recent URI bronchiectasis   Pulmonary exam normal breath sounds clear to auscultation       Cardiovascular hypertension, pulmonary hypertension(-) angina (-) Past MI, (-) Cardiac Stents and (-) CABG + dysrhythmias Atrial Fibrillation + Valvular Problems/Murmurs (mod-severe TR, mild MR) MR  Rhythm:Irregular Rate:Normal  Low-risk stress test 07/01/2022  TTE 05/19/2022: 1. Left ventricular ejection fraction, by estimation, is 50 to 55%. The  left ventricle has low normal function. The left ventricle has no regional  wall motion abnormalities. There is mild concentric left ventricular  hypertrophy. Left ventricular  diastolic parameters are indeterminate.   2. Right ventricular systolic function is normal. The right ventricular  size is mildly enlarged. There is moderately elevated pulmonary artery  systolic pressure. The estimated right ventricular systolic pressure is  01.7 mmHg.   3. Left atrial size was severely dilated.   4. Right atrial size was severely dilated.   5. The mitral valve is grossly normal. Mild mitral valve regurgitation.   6. Tricuspid valve regurgitation is moderate to severe, during some RR  intervals there is hepatic vein flow reversal.   7. The aortic valve is tricuspid. There is mild calcification of the  aortic valve. There is mild thickening of the aortic valve. Aortic valve  regurgitation is mild. Aortic valve sclerosis is present, with no evidence  of aortic valve stenosis.      Neuro/Psych negative neurological ROS     GI/Hepatic negative GI ROS, Neg liver ROS,,,  Endo/Other  negative endocrine ROS    Renal/GU negative Renal ROS     Musculoskeletal   Abdominal  (+) - obese  Peds  Hematology negative hematology ROS (+)   Anesthesia Other Findings Osteopenia,   Reproductive/Obstetrics                             Anesthesia Physical Anesthesia Plan  ASA: 3  Anesthesia Plan: General   Post-op Pain Management:    Induction: Intravenous  PONV Risk Score and Plan: Treatment may vary due to age or medical condition  Airway Management Planned: Mask and Natural Airway  Additional Equipment:   Intra-op Plan:   Post-operative Plan:   Informed Consent: I have reviewed the patients History and Physical, chart, labs and discussed the procedure including the risks, benefits and alternatives for the proposed anesthesia with the patient or authorized representative who has indicated his/her understanding and acceptance.     Dental advisory given  Plan Discussed with: CRNA and Anesthesiologist  Anesthesia Plan Comments: (Risks of general anesthesia discussed including, but not limited to, sore throat, hoarse voice, chipped/damaged teeth, injury to vocal cords, nausea and vomiting, allergic reactions, lung infection, heart attack, stroke, and death. All questions answered. )        Anesthesia Quick Evaluation

## 2022-07-22 NOTE — Anesthesia Postprocedure Evaluation (Signed)
Anesthesia Post Note  Patient: Amanda Sims  Procedure(s) Performed: CARDIOVERSION     Patient location during evaluation: PACU Anesthesia Type: General Level of consciousness: awake Pain management: pain level controlled Vital Signs Assessment: post-procedure vital signs reviewed and stable Respiratory status: spontaneous breathing, nonlabored ventilation and respiratory function stable Cardiovascular status: blood pressure returned to baseline and stable Postop Assessment: no apparent nausea or vomiting Anesthetic complications: no   No notable events documented.  Last Vitals:  Vitals:   07/22/22 1227 07/22/22 1230  BP: 128/64 125/80  Pulse: 65 (!) 52  Resp: (!) 21 (!) 21  SpO2: 94% 94%    Last Pain:  Vitals:   07/22/22 1230  PainSc: 0-No pain                 Nilda Simmer

## 2022-07-24 ENCOUNTER — Encounter (HOSPITAL_COMMUNITY): Payer: Self-pay | Admitting: Cardiology

## 2022-08-10 DIAGNOSIS — E559 Vitamin D deficiency, unspecified: Secondary | ICD-10-CM | POA: Diagnosis not present

## 2022-08-10 DIAGNOSIS — I482 Chronic atrial fibrillation, unspecified: Secondary | ICD-10-CM | POA: Diagnosis not present

## 2022-08-10 DIAGNOSIS — I10 Essential (primary) hypertension: Secondary | ICD-10-CM | POA: Diagnosis not present

## 2022-08-10 DIAGNOSIS — K219 Gastro-esophageal reflux disease without esophagitis: Secondary | ICD-10-CM | POA: Diagnosis not present

## 2022-08-10 DIAGNOSIS — E7800 Pure hypercholesterolemia, unspecified: Secondary | ICD-10-CM | POA: Diagnosis not present

## 2022-08-12 ENCOUNTER — Encounter: Payer: Self-pay | Admitting: Cardiology

## 2022-08-12 ENCOUNTER — Ambulatory Visit: Payer: Medicare Other | Attending: Cardiology | Admitting: Cardiology

## 2022-08-12 VITALS — BP 124/82 | HR 72 | Ht 64.0 in | Wt 166.4 lb

## 2022-08-12 DIAGNOSIS — R079 Chest pain, unspecified: Secondary | ICD-10-CM

## 2022-08-12 DIAGNOSIS — I4819 Other persistent atrial fibrillation: Secondary | ICD-10-CM | POA: Diagnosis not present

## 2022-08-12 DIAGNOSIS — I5033 Acute on chronic diastolic (congestive) heart failure: Secondary | ICD-10-CM | POA: Diagnosis not present

## 2022-08-12 DIAGNOSIS — I071 Rheumatic tricuspid insufficiency: Secondary | ICD-10-CM | POA: Diagnosis not present

## 2022-08-12 DIAGNOSIS — I10 Essential (primary) hypertension: Secondary | ICD-10-CM

## 2022-08-12 MED ORDER — FUROSEMIDE 20 MG PO TABS: 20.0000 mg | ORAL_TABLET | Freq: Every day | ORAL | 3 refills | Status: DC

## 2022-08-12 NOTE — Progress Notes (Signed)
Cardiology Office Note:    Date:  08/12/2022   ID:  Amanda Sims, DOB October 25, 1940, MRN [id number]  PCP:  Marda Stalker, PA-C  Cardiologist:  None  Electrophysiologist:  None   Referring MD: Marda Stalker, PA-C   No chief complaint on file.   History of Present Illness:    Amanda Sims is a 81 y.o. female with a hx of bronchiectasis, MAI who presents for follow-up.  She was referred by Marda Stalker, PA for evaluation of atrial fibrillation, initially seen 06/15/2022.  She was admitted 04/2022 with fatigue and cough, found to have COVID-19 infection.  While in ED, noted to be in A-fib with RVR.  She was started on Eliquis and initially diltiazem drip, switch to p.o. metoprolol on discharge.    She denies having palpitations.  Reports has had couple episodes of chest tightness over the last few months.  Describes as tightness in center of her chest when stressed.  She has not been exercising.  Does report some shortness of breath when she exerts herself.  Also having some lightheadedness, denies any syncope.  Does report having some lower extremity edema, worsened when her amlodipine was increased to 10 mg daily but has improved since back to 5 mg daily.  She is taking Eliquis, denies any bleeding issues.  Missed doses of Eliquis over the weekend.  No family history of heart disease.  Echocardiogram 05/19/2022 showed EF 50 to 55%, mild LVH, normal RV function, RVSP 55, severe biatrial enlargement, moderate to severe TR. Lexiscan Myoview on 07/01/2022 showed small area of ischemia in apical anterior wall (on my review appears normal perfusion), EF 59%.  Underwent successful cardioversion for A-fib on 07/22/2022  Since last clinic visit, she reports that she suspected she was back in A-fib but started feel short of breath and fatigued with walking.  She is taking Eliquis, denies any bleeding issues.  She has been taking Lasix about once per week.  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   amLODipine (NORVASC) 5 MG tablet Take 5 mg by mouth daily.   apixaban (ELIQUIS) 5 MG TABS tablet Take 1 tablet (5 mg total) by mouth 2 (two) times daily.   Calcium Carb-Cholecalciferol (CALCIUM 600 + D PO) Take 1 capsule by mouth daily.   cetirizine (ZYRTEC) 10 MG tablet Take 10 mg by mouth daily.   cholecalciferol (VITAMIN D3) 25 MCG (1000 UNIT) tablet Take 2,000 Units by mouth daily.   DULoxetine (CYMBALTA) 60 MG capsule Take 1 capsule by mouth daily.   Hypromellose (GENTEAL OP) Place 1 drop into both eyes daily as needed (dry eyes).   ketotifen (ZADITOR) 0.035 % ophthalmic solution Place 1 drop into both eyes daily as needed (itching).  lisinopril (PRINIVIL,ZESTRIL) 40 MG tablet Take 40 mg by mouth daily.   metoprolol succinate (TOPROL-XL) 50 MG 24 hr tablet Take 1 tablet (50 mg total) by mouth daily.   [DISCONTINUED] furosemide (LASIX) 20 MG tablet Take 20 mg daily as needed for swelling     Allergies:   Patient has no known allergies.   Social History   Socioeconomic History   Marital status: Married    Spouse name: Not on file   Number of children: Not on file   Years of education: Not on file   Highest education level: Not on file  Occupational History   Not on file   Tobacco Use   Smoking status: Never   Smokeless tobacco: Never  Vaping Use   Vaping Use: Never used  Substance and Sexual Activity   Alcohol use: Yes   Drug use: No   Sexual activity: Not on file  Other Topics Concern   Not on file  Social History Narrative   Not on file   Social Determinants of Health   Financial Resource Strain: Not on file  Food Insecurity: No Food Insecurity (05/19/2022)   Hunger Vital Sign    Worried About Running Out of Food in the Last Year: Never true    Ran Out of Food in the Last Year: Never true  Transportation Needs: No Transportation Needs (05/19/2022)   PRAPARE - Hydrologist (Medical): No    Lack of Transportation (Non-Medical): No  Physical Activity: Not on file  Stress: Not on file  Social Connections: Not on file     Family History: No family history of heart disease.    ROS:   Please see the history of present illness.     All other systems reviewed and are negative.  EKGs/Labs/Other Studies Reviewed:    The following studies were reviewed today:   EKG:   08/12/2022: Atrial fibrillation, rate 72 07/09/22: A-fib, rate 75 06/22/22: Afib, rate 71  Recent Labs: 05/19/2022: TSH 0.999 05/20/2022: ALT 31; B Natriuretic Peptide 248.2; Magnesium 1.9 07/15/2022: BUN 13; Creatinine, Ser 0.73; Hemoglobin 14.6; Platelets 140; Potassium 4.3; Sodium 142  Recent Lipid Panel No results found for: "CHOL", "TRIG", "HDL", "CHOLHDL", "VLDL", "LDLCALC", "LDLDIRECT"  Physical Exam:    VS:  BP 124/82   Pulse 72   Ht '5\' 4"'$  (1.626 m)   Wt 166 lb 6.4 oz (75.5 kg)   SpO2 97%   BMI 28.56 kg/m     Wt Readings from Last 3 Encounters:  08/12/22 166 lb 6.4 oz (75.5 kg)  07/09/22 164 lb 12.8 oz (74.8 kg)  06/22/22 162 lb (73.5 kg)     GEN:  Well nourished, well developed in no acute distress HEENT: Normal NECK: + JVD; No carotid bruits CARDIAC: irregular, normal rate, no murmurs, rubs, gallops RESPIRATORY:  Clear to  auscultation without rales, wheezing or rhonchi  ABDOMEN: Soft, non-tender, non-distended MUSCULOSKELETAL:  Trace edema; No deformity  SKIN: Warm and dry NEUROLOGIC:  Alert and oriented x 3 PSYCHIATRIC:  Normal affect   ASSESSMENT:    1. Persistent atrial fibrillation ([place])   2. Acute on chronic diastolic heart failure (HCC)   3. Chest pain of uncertain etiology   4. Tricuspid valve insufficiency, unspecified etiology   5. Essential hypertension     PLAN:    Atrial fibrillation: New diagnosis 04/2022 in setting of COVID-19 infection.  CHA2DS2-VASc score 4 (hypertension, age x2, female).  Underwent successful cardioversion on 07/22/2022, but now back  in A-fib -Continue Eliquis 5 mg twice daily -Continue Toprol-XL 50 mg daily.  Rates well-controlled -Given she appears symptomatic with dyspnea/fatigue despite rate controlled A-fib, recommend rhythm control strategy.  Refer to EP to consider antiarrhythmic versus ablation  Acute on chronic diastolic heart failure: Echocardiogram 05/19/2022 showed EF 50 to 55%, mild LVH, normal RV function, RVSP 55, severe biatrial enlargement, moderate to severe TR. Mildly hypervolemic on exam today -Likely due to A-fib.  Has been taking Lasix 20 mg daily as needed for swelling.  She appears mildly hypervolemic on exam today, recommend scheduling Lasix 20 mg daily.  Check BMET/magnesium in 1 week  Chest pain: Description suggest possible angina, as does report chest tightness that can occur when she is stressed.  Recent CT chest does not show coronary calcifications.  Not a candidate for coronary CTA given her A-fib.  Lexiscan Myoview on 07/01/2022 showed small area of ischemia in apical anterior wall (on my review appears normal perfusion), EF 59%.  Tricuspid regurgitation: Moderate to severe on echo 05/19/2022.  Will monitor with restoration of sinus rhythm and diuresis.  Hypertension: Continue amlodipine 5 mg daily, lisinopril 40 mg daily, Toprol-XL 50 mg  daily.  Appears controlled  RTC in 3 months     Medication Adjustments/Labs and Tests Ordered: Current medicines are reviewed at length with the patient today.  Concerns regarding medicines are outlined above.  Orders Placed This Encounter  Procedures   Basic metabolic panel   Magnesium   Ambulatory referral to Cardiac Electrophysiology   EKG 12-Lead   Meds ordered this encounter  Medications   furosemide (LASIX) 20 MG tablet    Sig: Take 1 tablet (20 mg total) by mouth daily.    Dispense:  90 tablet    Refill:  3    Patient Instructions  Medication Instructions:  Change furosemide (Lasix) to 20 mg daily  *If you need a refill on your cardiac medications before your next appointment, please call your pharmacy*   Lab Work: Please return for labs in 1 week (BMET, Mag)  Our in office lab hours are Monday-Friday 8:00-4:00, closed for lunch 12:45-1:45 pm.  No appointment needed.  [facility] locations:   [place] ([place]) - [address] [place] [address] - [address] [place] - [address] ([address]a  Follow-Up: At [facility], you and your health needs are our priority.  As part of our continuing mission to provide you with exceptional heart care, we have created designated Provider Care Teams.  These Care Teams include your primary Cardiologist (physician) and Advanced Practice Providers (APPs -  Physician Assistants and Nurse Practitioners) who all work together to provide you with the care you need, when you need it.  We recommend signing up for the patient portal called "MyChart".  Sign up information is provided on this After Visit Summary.  MyChart is used to  connect with patients for Virtual Visits (Telemedicine).  Patients are able to view lab/test results, encounter notes, upcoming appointments, etc.  Non-urgent messages can be sent to your provider as well.   To learn more about what you can do with MyChart, go  to [email or web address].    Your next appointment:   3 month(s)  The format for your next appointment:   In Person  Provider:   Dr. Gardiner Rhyme Other Instructions You have been referred to: Electrophysiology          Signed, Donato Heinz, MD  08/12/2022 4:18 PM    [place]

## 2022-08-12 NOTE — Patient Instructions (Signed)
Medication Instructions:  Change furosemide (Lasix) to 20 mg daily  *If you need a refill on your cardiac medications before your next appointment, please call your pharmacy*   Lab Work: Please return for labs in 1 week (BMET, Mag)  Our in office lab hours are Monday-Friday 8:00-4:00, closed for lunch 12:45-1:45 pm.  No appointment needed.  [facility] locations:   [place] ([place]) - [address] [place] - [address] [place] - [address] ([address]);a  Follow-Up: At [facility], you and your health needs are our priority.  As part of our continuing mission to provide you with exceptional heart care, we have created designated Provider Care Teams.  These Care Teams include your primary Cardiologist (physician) and Advanced Practice Providers (APPs -  Physician Assistants and Nurse Practitioners) who all work together to provide you with the care you need, when you need it.  We recommend signing up for the patient portal called "MyChart".  Sign up information is provided on this After Visit Summary.  MyChart is used to connect with patients for Virtual Visits (Telemedicine).  Patients are able to view lab/test results, encounter notes, upcoming appointments, etc.  Non-urgent messages can be sent to your provider as well.   To learn more about what you can do with MyChart, go to [email or web address].    Your next appointment:   3 month(s)  The format for your next appointment:   In Person  Provider:   Dr. Gardiner Rhyme Other Instructions You have been referred to: Electrophysiology

## 2022-08-15 ENCOUNTER — Other Ambulatory Visit: Payer: Self-pay | Admitting: Cardiology

## 2022-08-15 DIAGNOSIS — I4891 Unspecified atrial fibrillation: Secondary | ICD-10-CM

## 2022-09-01 DIAGNOSIS — I5033 Acute on chronic diastolic (congestive) heart failure: Secondary | ICD-10-CM | POA: Diagnosis not present

## 2022-09-01 DIAGNOSIS — I4819 Other persistent atrial fibrillation: Secondary | ICD-10-CM | POA: Diagnosis not present

## 2022-09-01 LAB — BASIC METABOLIC PANEL
BUN/Creatinine Ratio: 23 (ref 12–28)
BUN: 14 mg/dL (ref 8–27)
CO2: 27 mmol/L (ref 20–29)
Calcium: 9.1 mg/dL (ref 8.7–10.3)
Chloride: 100 mmol/L (ref 96–106)
Creatinine, Ser: 0.6 mg/dL (ref 0.57–1.00)
Glucose: 97 mg/dL (ref 70–99)
Potassium: 4.2 mmol/L (ref 3.5–5.2)
Sodium: 139 mmol/L (ref 134–144)
eGFR: 91 mL/min/{1.73_m2} (ref >59)

## 2022-09-01 LAB — MAGNESIUM: Magnesium: 1.9 mg/dL (ref 1.6–2.3)

## 2022-09-04 ENCOUNTER — Encounter: Payer: Self-pay | Admitting: *Deleted

## 2022-09-15 ENCOUNTER — Encounter: Payer: Self-pay | Admitting: Cardiology

## 2022-09-15 ENCOUNTER — Ambulatory Visit: Payer: Medicare Other | Attending: Cardiology | Admitting: Cardiology

## 2022-09-15 VITALS — BP 168/92 | HR 54 | Ht 64.0 in | Wt 162.0 lb

## 2022-09-15 DIAGNOSIS — I5032 Chronic diastolic (congestive) heart failure: Secondary | ICD-10-CM | POA: Diagnosis not present

## 2022-09-15 DIAGNOSIS — I10 Essential (primary) hypertension: Secondary | ICD-10-CM | POA: Diagnosis not present

## 2022-09-15 DIAGNOSIS — I4819 Other persistent atrial fibrillation: Secondary | ICD-10-CM

## 2022-09-15 DIAGNOSIS — D6869 Other thrombophilia: Secondary | ICD-10-CM

## 2022-09-15 NOTE — Progress Notes (Signed)
Electrophysiology Office Note   Date:  09/15/2022   ID:  Amanda Sims, Amanda Sims 11/25/1940, MRN [id number]  PCP:  Marda Stalker, PA-C  Cardiologist:  Gardiner Rhyme Primary Electrophysiologist:  Korde Jeppsen Meredith Leeds, MD    Chief Complaint: AF   History of Present Illness: Amanda Sims is a 82 y.o. female who is being seen today for the evaluation of AF at the request of Donato Heinz*. Presenting today for electrophysiology evaluation.  She has a history significant for bronchiectasis, hypertension, MAI, atrial fibrillation.  She was diagnosed with atrial fibrillation 06/15/2022.  She was admitted at that time with fever and cough and was found to have a COVID-19 infection.  While in the emergency room she was noted to be in rapid atrial fibrillation was started on Eliquis.  She had an echo that showed a normal ejection fraction and severe biatrial enlargement.  She underwent successful cardioversion 07/22/2022.  Unfortunately she did go back into atrial fibrillation with dyspnea on exertion and fatigue.  Today, she denies symptoms of palpitations, chest pain, orthopnea, PND, lower extremity edema, claudication, dizziness, presyncope, syncope, bleeding, or neurologic sequela. The patient is tolerating medications without difficulties.  She has no chest pain but has been more short of breath and fatigue.  She states that after cardioversion she remained in sinus rhythm for 4 days.  During that time, she felt well without major complaint.  She would like a rhythm control strategy.   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     amLODipine (NORVASC) 5 MG tablet Take 5 mg by mouth daily.     apixaban (ELIQUIS) 5 MG TABS tablet TAKE 1 TABLET(5 MG) BY MOUTH TWICE DAILY 180 tablet 1   Calcium Carb-Cholecalciferol (CALCIUM 600 + D PO) Take 1 capsule by mouth daily.     cetirizine (ZYRTEC) 10 MG tablet Take 10 mg by mouth daily.     cholecalciferol (VITAMIN D3) 25 MCG (1000 UNIT) tablet Take 2,000 Units by mouth daily.     DULoxetine (CYMBALTA) 60 MG capsule Take 1 capsule by mouth daily.  0   Hypromellose (GENTEAL OP) Place 1 drop into both eyes daily as needed (dry eyes).     ketotifen (ZADITOR) 0.035 % ophthalmic solution Place 1 drop into both eyes daily as needed (itching).     lisinopril (PRINIVIL,ZESTRIL) 40 MG tablet Take 40 mg by mouth daily.  0   metoprolol succinate (TOPROL-XL) 50 MG 24 hr tablet Take 1 tablet (50 mg total) by mouth daily.     furosemide (LASIX) 20 MG tablet Take 1 tablet (20 mg total) by mouth daily. (Patient not taking: Reported on 09/15/2022) 90 tablet 3   No current facility-administered medications for this visit.    Allergies:   Patient has no known allergies.   Social History:  The patient  reports that she has never smoked. She has never used smokeless tobacco. She  reports current alcohol use. She reports that she does not use drugs.   Family History:  The patient's family history is not on file.    ROS:  Please see the history of present illness.   Otherwise, review of systems is positive for none.   All other systems are reviewed and negative.    PHYSICAL EXAM: VS:  BP (!) 168/92   Pulse (!) 54   Ht '5\' 4"'$  (1.626 m)   Wt 162 lb (73.5 kg)   SpO2 96%   BMI 27.81 kg/m  , BMI Body mass index is 27.81 kg/m. GEN: Well nourished, well developed, in no acute distress   HEENT: normal  Neck: no JVD, carotid bruits, or masses Cardiac: irregular; no murmurs, rubs, or gallops,no edema  Respiratory:  clear to auscultation bilaterally, normal work of breathing GI: soft, nontender, nondistended, + BS MS: no deformity or atrophy  Skin: warm and dry Neuro:  Strength and sensation are intact Psych: euthymic mood, full affect  EKG:  EKG is not ordered today. Personal review of the ekg ordered 08/12/22 shows atrial fibrillation, rate 72  Recent Labs: 05/19/2022: TSH 0.999 05/20/2022: ALT 31; B Natriuretic Peptide 248.2 07/15/2022: Hemoglobin 14.6; Platelets 140 09/01/2022: BUN 14; Creatinine, Ser 0.60; Magnesium 1.9; Potassium 4.2; Sodium 139    Lipid Panel  No results found for: "CHOL", "TRIG", "HDL", "CHOLHDL", "VLDL", "LDLCALC", "LDLDIRECT"   Wt Readings from Last 3 Encounters:  09/15/22 162 lb (73.5 kg)  08/12/22 166 lb 6.4 oz (75.5 kg)  07/09/22 164 lb 12.8 oz (74.8 kg)      Other studies Reviewed: Additional studies/ records that were reviewed today include: TTE 05/19/22  Review of the above records today demonstrates:   1. Left ventricular ejection fraction, by estimation, is 50 to 55%. The  left ventricle has low normal function. The left ventricle has no regional  wall motion abnormalities. There is mild concentric left ventricular  hypertrophy. Left ventricular  diastolic parameters are indeterminate.   2. Right ventricular systolic function is normal. The right ventricular  size is mildly enlarged. There is moderately elevated pulmonary artery  systolic pressure. The estimated right ventricular systolic pressure is  16.1 mmHg.   3. Left atrial size was severely dilated.   4. Right atrial size was severely dilated.   5. The mitral valve is grossly normal. Mild mitral valve regurgitation.   6. Tricuspid valve regurgitation is moderate to severe, during some RR  intervals there is hepatic vein flow reversal.   7. The aortic valve is  tricuspid. There is mild calcification of the  aortic valve. There is mild thickening of the aortic valve. Aortic valve  regurgitation is mild. Aortic valve sclerosis is present, with no evidence  of aortic valve stenosis.   Myoview 07/01/2022   Findings are consistent with a small area of ischemia. The study is low risk.   No ST deviation was noted.   LV perfusion is abnormal. Defect 1: There is a small defect with mild reduction in uptake present in the apical anterior location(s) that is partially reversible. There is normal wall motion in the defect area. Consistent with ischemia.   Left ventricular function is normal. Nuclear stress EF: 59 %. The left ventricular ejection fraction is normal (55-65%). End diastolic cavity size is normal.   Prior study not available for comparison.  ASSESSMENT AND PLAN:  1.  Persistent atrial fibrillation: CHA2DS2-VASc of 4.  Currently on Eliquis 5 mg twice daily.  He is status post  cardioversion but unfortunately has gone back into atrial fibrillation.  She feels quite poorly with weakness, fatigue, shortness of breath.  She would prefer a rhythm control strategy.  I discussed with her ablation, or medical management with either amiodarone or flecainide.  Her QTc is mildly prolonged and thus I do not think dofetilide is a reasonable option.  She Jeraldin Fesler think about both of these and Kadeen Sroka let us know which she would prefer.  2.  Chronic diastolic heart failure: Likely exacerbated by atrial fibrillation.  Plan per primary cardiology.  3.  Hypertension: Currently well-controlled  4.  Secondary to coagula state: Currently on Eliquis for atrial fibrillation as above  Current medicines are reviewed at length with the patient today.   The patient does not have concerns regarding her medicines.  The following changes were made today:  none  Labs/ tests ordered today include:  No orders of the defined types were placed in this encounter.    Disposition:   FU  with Ania Levay 3 months  Signed, Levin Dagostino Meredith Leeds, MD  09/15/2022 2:00 PM     [address] [place] [address] [phone] (office) [phone] (fax)

## 2022-09-15 NOTE — Patient Instructions (Signed)
Medication Instructions:  Your physician recommends that you continue on your current medications as directed. Please refer to the Current Medication list given to you today.  *If you need a refill on your cardiac medications before your next appointment, please call your pharmacy*   Lab Work: None ordered   Testing/Procedures: None ordered   Follow-Up: At [facility], you and your health needs are our priority.  As part of our continuing mission to provide you with exceptional heart care, we have created designated Provider Care Teams.  These Care Teams include your primary Cardiologist (physician) and Advanced Practice Providers (APPs -  Physician Assistants and Nurse Practitioners) who all work together to provide you with the care you need, when you need it.   Your next appointment:   3 month(s)  The format for your next appointment:   In Person  Provider:   Allegra Lai, MD    Thank you for choosing [facility]!!   Trinidad Curet, RN [phone]  Other Instructions  Amiodarone Tablets What is this medication? AMIODARONE (a MEE oh da rone) prevents and treats a fast or irregular heartbeat (arrhythmia). It works by slowing down overactive electric signals in the heart, which stabilizes your heart rhythm. It belongs to a group of medications called antiarrhythmics. This medicine may be used for other purposes; ask your health care provider or pharmacist if you have questions. COMMON BRAND NAME(S): Cordarone, Pacerone What should I tell my care team before I take this medication? They need to know if you have any of these conditions: Liver disease Lung disease Other heart problems Thyroid disease An unusual or allergic reaction to amiodarone, iodine, other medications, foods, dyes, or preservatives Pregnant or trying to get pregnant Breast-feeding How should I use this medication? Take this medication by mouth with water. Take it as directed on the  prescription label at the same time every day. You can take it with or without food. You should always take it the same way. Keep taking it unless your care team tells you to stop. A special MedGuide will be given to you by the pharmacist with each prescription and refill. Be sure to read this information carefully each time. Talk to your care team about the use of this medication in children. Special care may be needed. Overdosage: If you think you have taken too much of this medicine contact a poison control center or emergency room at once. NOTE: This medicine is only for you. Do not share this medicine with others. What if I miss a dose? If you miss a dose, take it as soon as you can. If it is almost time for your next dose, take only that dose. Do not take double or extra doses. What may interact with this medication? Do not take this medication with any of the following: Abarelix Apomorphine Arsenic trioxide Certain antibiotics, such as erythromycin, gemifloxacin, levofloxacin, or pentamidine Certain medications for depression, such as amoxapine or tricyclic antidepressants Certain medications for fungal infections, such as fluconazole, itraconazole, ketoconazole, posaconazole, or voriconazole Certain medications for irregular heartbeat, such as disopyramide, dronedarone, ibutilide, propafenone, or sotalol Certain medications for malaria, such as chloroquine or halofantrine Cisapride Droperidol Haloperidol Haw[place]orn Maprotiline Methadone Phenothiazines, such as chlorpromazine, mesoridazine, or thioridazine Pimozide Ranolazine Red yeast rice Vardenafil This medication may also interact with the following: Antivirals for HIV Certain medications for blood pressure, heart disease, irregular heartbeat Certain medications for cholesterol, such as atorvastatin, cerivastatin, lovastatin, or simvastatin Certain medications for hepatitis C, such as  sofosbuvir and ledipasvir;  sofosbuvir Certain medications for seizures, such as phenytoin Certain medications for thyroid problems Certain medications that prevent or treat blood clots, such as warfarin Cholestyramine Cimetidine Clopidogrel Cyclosporine Dextromethorphan Diuretics Dofetilide Fentanyl General anesthetics Grapefruit juice Lidocaine Loratadine Methotrexate Other medications that cause heart rhythm changes Procainamide Quinidine Rifabutin, rifampin, or rifapentine St. John's Wort Trazodone Ziprasidone This list may not describe all possible interactions. Give your health care provider a list of all the medicines, herbs, non-prescription drugs, or dietary supplements you use. Also tell them if you smoke, drink alcohol, or use illegal drugs. Some items may interact with your medicine. What should I watch for while using this medication? Your condition will be monitored closely when you first begin therapy. This medication is often started in a hospital or other monitored health care setting. Once you are on maintenance therapy, visit your care team for regular checks on your progress. Because your condition and use of this medication carry some risk, it is a good idea to carry an identification card, necklace, or bracelet with details of your condition, medications, and care team. This medication may affect your coordination, reaction time, or judgment. Do not drive or operate machinery until you know how this medication affects you. Sit up or stand slowly to reduce the risk of dizzy or fainting spells. Drinking alcohol with this medication can increase the risk of these side effects. This medication can make you more sensitive to the sun. Keep out of the sun. If you cannot avoid being in the sun, wear protective clothing and sunscreen. Do not use sun lamps, tanning beds, or tanning booths. You should have regular eye exams before and during treatment. Call your care team if you have blurred vision, see  halos, or your eyes become sensitive to light. Your eyes may get dry. It may be helpful to use a lubricating eye solution or artificial tears solution. If you are going to have surgery or a procedure that requires contrast dyes, tell your care team that you are taking this medication. What side effects may I notice from receiving this medication? Side effects that you should report to your care team as soon as possible: Allergic reactions--skin rash, itching, hives, swelling of the face, lips, tongue, or throat Bluish-gray skin Change in vision such as blurry vision, seeing halos around lights, vision loss Heart failure--shortness of breath, swelling of the ankles, feet, or hands, sudden weight gain, unusual weakness or fatigue Heart rhythm changes--fast or irregular heartbeat, dizziness, feeling faint or lightheaded, chest pain, trouble breathing High thyroid levels (hyperthyroidism)--fast or irregular heartbeat, weight loss, excessive sweating or sensitivity to heat, tremors or shaking, anxiety, nervousness, irregular menstrual cycle or spotting Liver injury--right upper belly pain, loss of appetite, nausea, light-colored stool, dark yellow or brown urine, yellowing skin or eyes, unusual weakness or fatigue Low thyroid levels (hypothyroidism)--unusual weakness or fatigue, sensitivity to cold, constipation, hair loss, dry skin, weight gain, feelings of depression Lung injury--shortness of breath or trouble breathing, cough, spitting up blood, chest pain, fever Pain, tingling, or numbness in the hands or feet, muscle weakness, trouble walking, loss of balance or coordination Side effects that usually do not require medical attention (report to your care team if they continue or are bothersome): Nausea Vomiting This list may not describe all possible side effects. Call your doctor for medical advice about side effects. You may report side effects to FDA at 1-800-FDA-1088. Where should I keep my  medication? Keep out of the reach of children  and pets. Store at room temperature between 20 and 25 degrees C (68 and 77 degrees F). Protect from light. Keep container tightly closed. Throw away any unused medication after the expiration date. NOTE: This sheet is a summary. It may not cover all possible information. If you have questions about this medicine, talk to your doctor, pharmacist, or health care provider.  2023 Elsevier/Gold Standard (2020-10-04 00:00:00)     Flecainide Tablets What is this medication? FLECAINIDE (FLEK a nide) prevents and treats a fast or irregular heartbeat (arrhythmia). It is often used to treat a type of arrhythmia known as AFib (atrial fibrillation). It works by slowing down overactive electric signals in the heart, which stabilizes your heart rhythm. It belongs to a group of medications called antiarrhythmics. This medicine may be used for other purposes; ask your health care provider or pharmacist if you have questions. COMMON BRAND NAME(S): Tambocor What should I tell my care team before I take this medication? They need to know if you have any of these conditions: High or low levels of potassium in the blood Heart disease including heart rhythm and heart rate problems Kidney disease Liver disease Recent heart attack An unusual or allergic reaction to flecainide, other medications, foods, dyes, or preservatives Pregnant or trying to get pregnant Breastfeeding How should I use this medication? Take this medication by mouth with a glass of water. Take it as directed on the prescription label at the same time every day. You can take it with or without food. If it upsets your stomach, take it with food. Do not take your medication more often than directed. Do not stop taking this medication suddenly. This may cause serious, heart-related side effects. If your care team wants you to stop the medication, the dose may be slowly lowered over time to avoid any side  effects. Talk to your care team about the use of this medication in children. While it may be prescribed for children as young as 1 year for selected conditions, precautions do apply. Overdosage: If you think you have taken too much of this medicine contact a poison control center or emergency room at once. NOTE: This medicine is only for you. Do not share this medicine with others. What if I miss a dose? If you miss a dose, take it as soon as you can. If it is almost time for your next dose, take only that dose. Do not take double or extra doses. What may interact with this medication? Do not take this medication with any of the following: Amoxapine Arsenic trioxide Certain antibiotics, such as clarithromycin, erythromycin, gatifloxacin, gemifloxacin, levofloxacin, moxifloxacin, sparfloxacin, or troleandomycin Certain antidepressants, called tricyclic antidepressants such as amitriptyline, imipramine, or nortriptyline Certain medications for irregular heartbeat, such as disopyramide, encainide, moricizine, procainamide, propafenone, and quinidine Cisapride Delavirdine Droperidol Haloperidol Hawthorn Imatinib Levomethadyl Maprotiline Medications for malaria, such as chloroquine and halofantrine Pentamidine Phenothiazines, such as chlorpromazine, mesoridazine, prochlorperazine, thioridazine Pimozide Quinine Ranolazine Ritonavir Sertindole This medication may also interact with the following: Cimetidine Dofetilide Medications for angina or blood pressure Medications for irregular heartbeat, such as amiodarone and digoxin Ziprasidone This list may not describe all possible interactions. Give your health care provider a list of all the medicines, herbs, non-prescription drugs, or dietary supplements you use. Also tell them if you smoke, drink alcohol, or use illegal drugs. Some items may interact with your medicine. What should I watch for while using this medication? Visit your care  team for regular checks on your progress. Because  your condition and the use of this medication carries some risk, it is a good idea to carry an identification card, necklace, or bracelet with details of your condition, medications, and care team. Check your blood pressure and pulse rate as directed. Know what your blood pressure and pulse rate should be and when tod contact your care team. Your care team may schedule regular blood tests and electrocardiograms to check your progress. This medication may affect your coordination, reaction time, or judgment. Do not drive or operate machinery until you know how this medication affects you. Sit up or stand slowly to reduce the risk of dizzy or fainting spells. Drinking alcohol with this medication can increase the risk of these side effects. What side effects may I notice from receiving this medication? Side effects that you should report to your care team as soon as possible: Allergic reactions--skin rash, itching, hives, swelling of the face, lips, tongue, or throat Heart failure--shortness of breath, swelling of the ankles, feet, or hands, sudden weight gain, unusual weakness or fatigue Heart rhythm changes--fast or irregular heartbeat, dizziness, feeling faint or lightheaded, chest pain, trouble breathing Liver injury--right upper belly pain, loss of appetite, nausea, light-colored stool, dark yellow or brown urine, yellowing skin or eyes, unusual weakness or fatigue Side effects that usually do not require medical attention (report to your care team if they continue or are bothersome): Blurry vision Constipation Dizziness Fatigue Headache Nausea Tremors or shaking This list may not describe all possible side effects. Call your doctor for medical advice about side effects. You may report side effects to FDA at 1-800-FDA-1088. Where should I keep my medication? Keep out of the reach of children and pets. Store at room temperature between 15 and 30  degrees C (59 and 86 degrees F). Protect from light. Keep container tightly closed. Throw away any unused medication after the expiration date. NOTE: This sheet is a summary. It may not cover all possible information. If you have questions about this medicine, talk to your doctor, pharmacist, or health care provider.  2023 Elsevier/Gold Standard (2004-10-24 00:00:00)    Cardiac Ablation Cardiac ablation is a procedure to destroy (ablate) heart tissue that is sending bad signals. These bad signals cause the heart to beat very fast or in a way that is not normal. Destroying some tissues can help make the heart rhythm normal. Tell your doctor about: Any allergies you have. All medicines you are taking. These include vitamins, herbs, eye drops, creams, and over-the-counter medicines. Any problems you or family members have had with anesthesia. Any bleeding problems you have. Any surgeries you have had. Any medical conditions you have. Whether you are pregnant or may be pregnant. What are the risks? Your doctor will talk with you about risks. These may include: Infection. Bruising and bleeding. Stroke or blood clots. Damage to nearby areas of your body. Allergies to medicines or dyes. Needing a pacemaker if the heart gets damaged. A pacemaker helps the heart beat normally. The procedure not working. What happens before the procedure? Medicines Ask your doctor about changing or stopping: Your normal medicines. Vitamins, herbs, and supplements. Over-the-counter medicines. Do not take aspirin or ibuprofen unless you are told to. General instructions Follow instructions from your doctor about what you may eat and drink. If you will be going home right after the procedure, plan to have a responsible adult: Take you home from the hospital or clinic. You will not be allowed to drive. Care for you for the time you  are told. Ask your doctor what steps will be taken to prevent the spread of  germs. What happens during the procedure?  An IV tube will be put into one of your veins. You may be given: A sedative. This helps you relax. Anesthesia. This will: Numb certain areas of your body. The skin on your neck or groin will be numbed. A cut (incision) will be made in your neck or groin. A needle will be put through the cut and into a large vein. The small, thin tube (catheter) will be put into the needle. The tube will be moved to your heart. A type of X-ray (fluoroscopy) will be used to help guide the tube. It will also show constant images of the heart on a screen. Dye may be put through the tube. This helps your doctor see your heart. An electric current will be sent from the tube to destroy heart tissue in certain areas. The tube will be taken out. Pressure will be held on your cut. This helps stop bleeding. A bandage (dressing) will be put over your cut. The procedure may vary among doctors and hospitals. What happens after the procedure? You will be monitored until you leave the hospital or clinic. This includes checking your blood pressure, heart rate and rhythm, breathing rate, and blood oxygen level. Your cut will be checked for bleeding. You will need to lie still for a few hours. If your groin was used, you will need to keep your leg straight for a few hours after the small, thin tube is removed. This information is not intended to replace advice given to you by your health care provider. Make sure you discuss any questions you have with your health care provider. Document Revised: 01/27/2022 Document Reviewed: 01/27/2022 Elsevier Patient Education  [place].

## 2022-10-20 ENCOUNTER — Ambulatory Visit: Payer: Medicare Other | Admitting: Internal Medicine

## 2022-11-03 ENCOUNTER — Ambulatory Visit: Payer: Medicare Other | Admitting: Cardiology

## 2022-11-07 NOTE — Progress Notes (Signed)
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 Her pulmonologist in [place] followed her conservatively. There was another brief episode of self-limited hemoptysis in March of 2012. She has had bronchoscopy x2. CT chest without contrast on 02/15/2011 showed areas of reticular nodularity and scarring in the right middle lobe, PFT 07/09/2011-mild obstructive airways disease with minimal response to bronchodilator. Air-trapping confirming obstruction. Normal diffusion. FEV1 1.84/89%, FEV1/FVC 0.71, FEF 25-75% 1.06/45% after bronchodilator. Office Spirometry 11/22/2015-mild obstructive airways disease. FVC 2.14/76%, FEV1 1.49/70%, FEV1/FVC 0.70, FEF 25-75 percent 0.85/46%. -----------------------------------------------------------------------------------------------------------   12/05/21- 82 year old female never smoker with MAIC, Bronchiectasis( Dx 2011/ [place]),/ COPD,  hemoptysis, DOE, complicated by Obesity, HTN,  Covid vax- 3 Phiizer Flu vax-had She had reported Stiolto sample caused increased thirst. She had requested script on 3/15. NP had suggested Anoro trial if having "airway issues". She complains that LAMA therapies cause dry mouth.  Alternatives discussed. Little cough and no night sweats. CXR 10/22/21- IMPRESSION: 1. No radiographic evidence of acute cardiopulmonary disease. 2. Diffuse peribronchial cuffing, similar to the prior study, suggesting chronic bronchitis. No definitive bronchiectasis confidently identified radiographically. Further evaluation with high-resolution chest CT could be considered if clinically appropriate. 3. Mild cardiomegaly. 4. Aortic atherosclerosis.  11/10/22- 82 year old female never smoker with MAIC, Bronchiectasis( Dx 2011/ [place]),/ COPD,  hemoptysis, DOE, complicated by Obesity, HTN, AFib/Eliquis,  Covid  vax- 3 Phiizer Flu vax- She had reported Stiolto sample caused increased thirst. She had requested script on 3/15. NP had suggested Anoro trial if having "airway issues". She complains that LAMA therapies cause dry mouth.  Alternatives discussed. Hosp in November with Covid, Afib.  Now she says she has little cough and no phlegm, no night sweats.  She avoids using inhalers because of dry mouth and her A-fib.     CT reviewed. CT chest 05/18/22-  IMPRESSION: 1. Minimal bronchiectasis and peripheral mucous plugging in the right upper lobe and right middle lobe. 2. Atelectasis in the lingula. 3. 3 mm right solid pulmonary nodule within the upper lobe. Per Fleischner Society Guidelines, a non-contrast Chest CT at 12 months is optional. If performed and the nodule is stable at 12 months, no further follow-up is recommended. These guidelines do not apply to immunocompromised patients and patients with cancer. Follow up in patients with significant comorbidities as clinically warranted. For lung cancer screening, adhere to Lung-RADS guidelines. Reference: Radiology. 2017; 284(1):228-43. 4. Moderate cardiomegaly. Aortic Atherosclerosis (ICD10-I70.0) and Emphysema (ICD10-J43.9).  ROS-see HPI     += positive  Constitutional:   No-   weight loss, +night sweats,  No-fevers, chills, fatigue, lassitude. HEENT:   No-  headaches, difficulty swallowing, tooth/dental problems, sore throat,       No-  sneezing, itching, ear ache, nasal congestion, post nasal drip,  CV:  No-   chest pain, orthopnea, PND, swelling in lower extremities, anasarca, dizziness, palpitations Resp: +shortness of breath with exertion or at rest.             productive cough,  No non-productive cough,  No- coughing up of blood.              No-   change in color of mucus.  No- wheezing.   Skin: No-   rash or lesions. GI:  No-   heartburn, indigestion, abdominal pain, nausea, vomiting,  GU: MS:  No-   joint pain or swelling.   Neuro-  nothing unusual Psych:  No- change in mood or affect. No depression or anxiety.  No memory loss.  OBJ     General- Alert, Oriented, Affect-appropriate, Distress- none acute; well-appearing.     Skin- rash-none, lesions- none, excoriation- none Lymphadenopathy- none Head- atraumatic            Eyes- Gross vision intact, PERRLA, conjunctivae clear secretions            Ears-+ mild hard of hearing            Nose- Clear, no-Septal dev, mucus, polyps, erosion, perforation             Throat- Mallampati II , mucosa clear , drainage- none, tonsils- atrophic Neck- flexible , trachea midline, no stridor , thyroid nl, carotid no bruit Chest - symmetrical excursion , unlabored           Heart/CV- RRR/ occ extras , no murmur , no gallop  , no rub, nl s1 s2                           - JVD- none , edema- none, stasis changes- none, varices- none           Lung- clear to P&A, wheeze- none, cough- none , dullness-none, rub- none           Chest wall-  Abd-  Br/ Gen/ Rectal- Not done, not indicated Extrem- cyanosis- none, clubbing, none, atrophy- none, strength- nl, +spider veins Neuro- grossly intact to observation

## 2022-11-10 ENCOUNTER — Telehealth: Payer: Self-pay

## 2022-11-10 ENCOUNTER — Ambulatory Visit: Payer: Medicare Other | Admitting: Internal Medicine

## 2022-11-10 ENCOUNTER — Encounter: Payer: Self-pay | Admitting: Internal Medicine

## 2022-11-10 VITALS — BP 118/72 | HR 79 | Ht 64.0 in | Wt 166.4 lb

## 2022-11-10 DIAGNOSIS — A310 Pulmonary mycobacterial infection: Secondary | ICD-10-CM | POA: Diagnosis not present

## 2022-11-10 DIAGNOSIS — J479 Bronchiectasis, uncomplicated: Secondary | ICD-10-CM

## 2022-11-10 NOTE — Patient Instructions (Signed)
Order- CT chest no contrast in September, 2024     dx bronchiectasis, lung nodule

## 2022-11-10 NOTE — Telephone Encounter (Signed)
ATC X1 unable to lvm. Patient needs a 44month f/u with Dr. Delos Haring end of september

## 2022-11-19 ENCOUNTER — Ambulatory Visit: Payer: Medicare Other | Admitting: Student

## 2022-12-07 ENCOUNTER — Encounter: Payer: Self-pay | Admitting: *Deleted

## 2022-12-13 NOTE — Progress Notes (Unsigned)
Cardiology Clinic Note   Date: 12/15/2022 ID: [name], [name] 07/18/1941, MRN [id number]  Primary Cardiologist:  Little Ishikawa, MD  Patient Profile    Amanda Sims is a 82 y.o. female who presents to the clinic today for 26-month follow-up.  Past medical history significant for: PAF. Onset September 2023. DCCV 07/22/2022: Successful. Return to A-fib December 2023. Chronic diastolic heart failure. Echo 05/19/2022: EF 50 to 55%.  Mild concentric LVH.  Mildly enlarged RV.  Moderately elevated PA systolic pressure.  Severe BAE.  Mild MR.  Moderate to severe TR.  Aortic valve sclerosis without stenosis.  Mild AI. Hypertension. Pulmonary hypertension.   History of Present Illness    Amanda Sims was first evaluated by Dr. Bjorn Pippin on 06/22/2022 for A-fib at the request of Jarrett Soho, PA-C.  Patient found to be in A-fib with RVR during hospital admission for COVID infection.  Eliquis was initiated and she was treated with Cardizem drip.  Discharged on p.o. metoprolol.  Patient was last seen in the office by Dr. Bjorn Pippin on 08/12/2022.  At that time she complained of shortness of breath and fatigue with walking.  She was found to be slightly hypervolemic and EKG revealed rate controlled A-fib.  Lasix was increased to daily dosing and she was referred to EP.  Patient was evaluated by Dr. Elberta Fortis on 09/15/2022.  At that time she reported preferring rhythm control and ablation versus medical management was discussed.  Given mild prolonged QT Tikosyn would likely not be an option for treatment.  Patient wanted to think things over at that time.  Today, patient is doing well. She is recovering from a recent URI. Patient denies shortness of breath or dyspnea on exertion. No chest pain, pressure, or tightness. Denies orthopnea or PND. No palpitations. She has no cardiac awareness of afib. She reports chronic lower extremity edema that she manages with prn Lasix. She has not  needed Lasix in the last 4-5 weeks. Stable daily weights. She is the primary caregiver for her husband who undergoes hemodialysis three days a week and has other health issues. They moved to [facility] about 8 months ago and she feels this has helped her stress level somewhat. She reports she and her family decided to hold off on any interventions for her afib. She feels it would be too much to handle with everything going on with her husband. She is tolerating her medications well. She denies blood in urine or stool or other bleeding concerns.    ROS: All other systems reviewed and are otherwise negative except as noted in History of Present Illness.  Studies Reviewed    ECG is not ordered today.   Risk Assessment/Calculations     CHA2DS2-VASc Score = 5   This indicates a 7.2% annual risk of stroke. The patient's score is based upon: CHF History: 1 HTN History: 1 Diabetes History: 0 Stroke History: 0 Vascular Disease History: 0 Age Score: 2 Gender Score: 1             Physical Exam    VS:  BP 132/72 (BP Location: Right Arm, Patient Position: Sitting, Cuff Size: Normal)   Pulse 72   Ht  (1.626 m)   Wt 165 lb 9.6 oz (75.1 kg)   SpO2 98%   BMI 28.43 kg/m  , BMI Body mass index is 28.43 kg/m.  GEN: Well nourished, well developed, in no acute distress. Neck: No JVD or carotid bruits. Cardiac:  Irregular rhythm, controlled rate. No murmurs. No rubs or gallops.   Respiratory:  Respirations regular and unlabored. Clear to auscultation without rales, wheezing or rhonchi. GI: Soft, nontender, nondistended. Extremities: Radials/DP/PT 2+ and equal bilaterally. No clubbing or cyanosis. No edema.  Skin: Warm and dry, no rash. Neuro: Strength intact.  Assessment & Plan   PAF.  Onset September 2023 in the setting of COVID infection.  Patient underwent successful DCCV November 2023 but unfortunately went back into A-fib in December 2023.  She was evaluated  by Dr. Elberta Fortis in January 2024 to discuss rhythm control treatment.  Patient denies palpitations, shortness of breath, DOE, or increased lower extremity edema. Denies spontaneous bleeding concerns.  Irregular rhythm, controlled rate today. Continue metoprolol and Eliquis. She would like to hold off follow-up with Dr. Elberta Fortis at this time, as she is not ready to undergo any type of procedure for afib.  Chronic diastolic heart failure.  Echo September 2023 showed low normal LV function, severe BAE, mild MR, moderate to severe TR, mild AI.  Patient denies shortness of breath or DOE. She has chronic lower extremity edema that she manages with prn Lasix. She has not needed Lasix for the last 4-5 weeks. Daily weight is stable.  Euvolemic and well compensated on exam.  Continue metoprolol, lisinopril, Lasix. Hypertension.  BP today 132/72. Patient denies headaches or dizziness.  Continue amlodipine, lisinopril, metoprolol.  Disposition: Return in 6 months or sooner as needed.          Signed, Etta Grandchild. Brittie Whisnant, DNP, NP-C

## 2022-12-15 ENCOUNTER — Encounter: Payer: Self-pay | Admitting: Student

## 2022-12-15 ENCOUNTER — Ambulatory Visit: Payer: Medicare Other | Attending: Cardiology | Admitting: Student

## 2022-12-15 VITALS — BP 132/72 | HR 72 | Ht 64.0 in | Wt 165.6 lb

## 2022-12-15 DIAGNOSIS — I10 Essential (primary) hypertension: Secondary | ICD-10-CM

## 2022-12-15 DIAGNOSIS — I480 Paroxysmal atrial fibrillation: Secondary | ICD-10-CM

## 2022-12-15 DIAGNOSIS — I5032 Chronic diastolic (congestive) heart failure: Secondary | ICD-10-CM

## 2022-12-15 NOTE — Patient Instructions (Signed)
Medication Instructions:  Your physician recommends that you continue on your current medications as directed. Please refer to the Current Medication list given to you today.  *If you need a refill on your cardiac medications before your next appointment, please call your pharmacy*   Lab Work: NONE If you have labs (blood work) drawn today and your tests are completely normal, you will receive your results only by: MyChart Message (if you have MyChart) OR A paper copy in the mail If you have any lab test that is abnormal or we need to change your treatment, we will call you to review the results.   Testing/Procedures: NONE   Follow-Up: At [facility], you and your health needs are our priority.  As part of our continuing mission to provide you with exceptional heart care, we have created designated Provider Care Teams.  These Care Teams include your primary Cardiologist (physician) and Advanced Practice Providers (APPs -  Physician Assistants and Nurse Practitioners) who all work together to provide you with the care you need, when you need it.  We recommend signing up for the patient portal called "MyChart".  Sign up information is provided on this After Visit Summary.  MyChart is used to connect with patients for Virtual Visits (Telemedicine).  Patients are able to view lab/test results, encounter notes, upcoming appointments, etc.  Non-urgent messages can be sent to your provider as well.   To learn more about what you can do with MyChart, go to [email or web address].    Your next appointment:   6 month(s)  Provider:   Little Ishikawa, MD

## 2022-12-17 ENCOUNTER — Encounter: Payer: Self-pay | Admitting: Internal Medicine

## 2022-12-17 NOTE — Assessment & Plan Note (Signed)
Chronic changes evident on CT. Plan-follow for now with imaging unless cough becomes productive for culture.

## 2022-12-17 NOTE — Assessment & Plan Note (Addendum)
Mild with adequate symptom control, no recent exacerbation.  She has been diagnosed with [facility] before( 2012), but cough is not productive for updated culture.

## 2022-12-18 ENCOUNTER — Ambulatory Visit: Payer: Medicare Other | Admitting: Cardiology

## 2023-01-08 DIAGNOSIS — X32XXXD Exposure to sunlight, subsequent encounter: Secondary | ICD-10-CM | POA: Diagnosis not present

## 2023-01-08 DIAGNOSIS — L820 Inflamed seborrheic keratosis: Secondary | ICD-10-CM | POA: Diagnosis not present

## 2023-01-08 DIAGNOSIS — L570 Actinic keratosis: Secondary | ICD-10-CM | POA: Diagnosis not present

## 2023-01-08 DIAGNOSIS — L821 Other seborrheic keratosis: Secondary | ICD-10-CM | POA: Diagnosis not present

## 2023-02-13 ENCOUNTER — Other Ambulatory Visit: Payer: Self-pay | Admitting: Cardiology

## 2023-02-13 DIAGNOSIS — I4891 Unspecified atrial fibrillation: Secondary | ICD-10-CM

## 2023-02-15 NOTE — Telephone Encounter (Signed)
Prescription refill request for Eliquis received. Indication:afib Last office visit:4/24 Scr:0.60  1/24 Age: 82 Weight:75.1  kg  Prescription refilled

## 2023-03-01 DIAGNOSIS — L570 Actinic keratosis: Secondary | ICD-10-CM | POA: Diagnosis not present

## 2023-03-01 DIAGNOSIS — L820 Inflamed seborrheic keratosis: Secondary | ICD-10-CM | POA: Diagnosis not present

## 2023-03-01 DIAGNOSIS — L821 Other seborrheic keratosis: Secondary | ICD-10-CM | POA: Diagnosis not present

## 2023-03-01 DIAGNOSIS — X32XXXD Exposure to sunlight, subsequent encounter: Secondary | ICD-10-CM | POA: Diagnosis not present

## 2023-03-23 DIAGNOSIS — M8580 Other specified disorders of bone density and structure, unspecified site: Secondary | ICD-10-CM | POA: Diagnosis not present

## 2023-03-23 DIAGNOSIS — E559 Vitamin D deficiency, unspecified: Secondary | ICD-10-CM | POA: Diagnosis not present

## 2023-03-23 DIAGNOSIS — I700 Atherosclerosis of aorta: Secondary | ICD-10-CM | POA: Diagnosis not present

## 2023-03-23 DIAGNOSIS — I480 Paroxysmal atrial fibrillation: Secondary | ICD-10-CM | POA: Diagnosis not present

## 2023-03-23 DIAGNOSIS — E7800 Pure hypercholesterolemia, unspecified: Secondary | ICD-10-CM | POA: Diagnosis not present

## 2023-03-23 DIAGNOSIS — J479 Bronchiectasis, uncomplicated: Secondary | ICD-10-CM | POA: Diagnosis not present

## 2023-03-23 DIAGNOSIS — I110 Hypertensive heart disease with heart failure: Secondary | ICD-10-CM | POA: Diagnosis not present

## 2023-03-23 DIAGNOSIS — D6869 Other thrombophilia: Secondary | ICD-10-CM | POA: Diagnosis not present

## 2023-03-23 DIAGNOSIS — Z0000 Encounter for general adult medical examination without abnormal findings: Secondary | ICD-10-CM | POA: Diagnosis not present

## 2023-03-26 DIAGNOSIS — L820 Inflamed seborrheic keratosis: Secondary | ICD-10-CM | POA: Diagnosis not present

## 2023-03-26 DIAGNOSIS — D225 Melanocytic nevi of trunk: Secondary | ICD-10-CM | POA: Diagnosis not present

## 2023-03-26 DIAGNOSIS — L821 Other seborrheic keratosis: Secondary | ICD-10-CM | POA: Diagnosis not present

## 2023-03-26 DIAGNOSIS — B078 Other viral warts: Secondary | ICD-10-CM | POA: Diagnosis not present

## 2023-05-10 ENCOUNTER — Ambulatory Visit (HOSPITAL_COMMUNITY)
Admission: RE | Admit: 2023-05-10 | Discharge: 2023-05-10 | Disposition: A | Discharge status: Home / Self Care | Payer: Medicare Other | Source: Ambulatory Visit | Attending: Internal Medicine | Admitting: Internal Medicine

## 2023-05-10 ENCOUNTER — Encounter (HOSPITAL_COMMUNITY): Payer: Self-pay

## 2023-05-10 DIAGNOSIS — R918 Other nonspecific abnormal finding of lung field: Secondary | ICD-10-CM | POA: Diagnosis not present

## 2023-05-10 DIAGNOSIS — R590 Localized enlarged lymph nodes: Secondary | ICD-10-CM | POA: Diagnosis not present

## 2023-05-10 DIAGNOSIS — J479 Bronchiectasis, uncomplicated: Secondary | ICD-10-CM | POA: Diagnosis not present

## 2023-05-10 DIAGNOSIS — R911 Solitary pulmonary nodule: Secondary | ICD-10-CM | POA: Diagnosis not present

## 2023-05-12 NOTE — Progress Notes (Signed)
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 Her pulmonologist in [place] followed her conservatively. There was another brief episode of self-limited hemoptysis in March of 2012. She has had bronchoscopy x2. CT chest without contrast on 02/15/2011 showed areas of reticular nodularity and scarring in the right middle lobe, PFT 07/09/2011-mild obstructive airways disease with minimal response to bronchodilator. Air-trapping confirming obstruction. Normal diffusion. FEV1 1.84/89%, FEV1/FVC 0.71, FEF 25-75% 1.06/45% after bronchodilator. Office Spirometry 11/22/2015-mild obstructive airways disease. FVC 2.14/76%, FEV1 1.49/70%, FEV1/FVC 0.70, FEF 25-75 percent 0.85/46%. -----------------------------------------------------------------------------------------------------------   11/10/22- 82 year old female never smoker with MAIC, Bronchiectasis( Dx 2011/ [place]),/ COPD,  hemoptysis, DOE, complicated by Obesity, HTN, AFib/Eliquis,  Covid vax- 3 Phiizer Flu vax- She had reported Stiolto sample caused increased thirst. She had requested script on 3/15. NP had suggested Anoro trial if having "airway issues". She complains that LAMA therapies cause dry mouth.  Alternatives discussed. Hosp in November with Covid, Afib.  Now she says she has little cough and no phlegm, no night sweats.  She avoids using inhalers because of dry mouth and her A-fib.     CT reviewed. CT chest 05/18/22-  IMPRESSION: 1. Minimal bronchiectasis and peripheral mucous plugging in the right upper lobe and right middle lobe. 2. Atelectasis in the lingula. 3. 3 mm right solid pulmonary nodule within the upper lobe. Per Fleischner Society Guidelines, a non-contrast Chest CT at 12 months is optional. If performed and the nodule is stable at 12 months, no further follow-up is  recommended. These guidelines do not apply to immunocompromised patients and patients with cancer. Follow up in patients with significant comorbidities as clinically warranted. For lung cancer screening, adhere to Lung-RADS guidelines. Reference: Radiology. 2017; 284(1):228-43. 4. Moderate cardiomegaly. Aortic Atherosclerosis (ICD10-I70.0) and Emphysema (ICD10-J43.9).  05/13/23- 82 year old female never smoker with MAIC, Bronchiectasis( Dx 2011/ [place]),/ COPD, Lung Nodule,  hemoptysis, DOE, complicated by Obesity, HTN, AFib/Eliquis, PHTN by Echo,  She is aware of occasional wheeze mostly at night, lying down, but says it is not bothering her.  She feels controlled.  Not much productive cough.  Not much dry cough despite lisinopril. Recently lost husband. We are going to check overnight oximetry. ECHO 2023- 50-55%, RAE, Mod PHN, no aortic stenosis CT chest 05/10/23 IMPRESSION: 1. Stable 3 mm right upper lobe pulmonary nodule considered definitively benign requiring no future imaging follow-up. 2. Areas of bronchiectasis and chronic postinfectious or inflammatory scarring redemonstrated, as above. 3. Mild air trapping indicative of small airways disease. 4. Severe tracheobronchomalacia. 5. Cardiomegaly with biatrial dilatation. 6. Aortic atherosclerosis. 7. There are calcifications of the aortic valve. Echocardiographic correlation for evaluation of potential valvular dysfunction may be warranted if clinically indicated. Aortic Atherosclerosis (ICD10-I70.0).  ROS-see HPI     += positive  Constitutional:   No-   weight loss, +night sweats,  No-fevers, chills, fatigue, lassitude. HEENT:   No-  headaches, difficulty swallowing, tooth/dental problems, sore throat,       No-  sneezing, itching, ear ache, nasal congestion, post nasal drip,  CV:  No-   chest pain, orthopnea, PND, swelling in lower extremities, anasarca, dizziness, palpitations Resp: +shortness of breath with exertion or at  rest.             productive cough,  No non-productive cough,  No- coughing up of blood.  No-   change in color of mucus.  No- wheezing.   Skin: No-   rash or lesions. GI:  No-   heartburn, indigestion, abdominal pain, nausea, vomiting,  GU: MS:  No-   joint pain or swelling.  Neuro-     nothing unusual Psych:  No- change in mood or affect. No depression or anxiety.  No memory loss.  OBJ     General- Alert, Oriented, Affect-appropriate, Distress- none acute; well-appearing.     Skin- rash-none, lesions- none, excoriation- none Lymphadenopathy- none Head- atraumatic            Eyes- Gross vision intact, PERRLA, conjunctivae clear secretions            Ears-+ mild hard of hearing            Nose- Clear, no-Septal dev, mucus, polyps, erosion, perforation             Throat- Mallampati II , mucosa clear , drainage- none, tonsils- atrophic Neck- flexible , trachea midline, no stridor , thyroid nl, carotid no bruit Chest - symmetrical excursion , unlabored           Heart/CV- RRR/ occ extras , no murmur , no gallop  , no rub, nl s1 s2                           - JVD- none , edema- none, stasis changes- none, varices- none           Lung- clear to P&A, wheeze- none, cough- none , dullness-none, rub- none           Chest wall-  Abd-  Br/ Gen/ Rectal- Not done, not indicated Extrem- cyanosis- none, clubbing, none, atrophy- none, strength- nl, +spider veins Neuro- grossly intact to observation

## 2023-05-13 ENCOUNTER — Ambulatory Visit: Payer: Medicare Other | Admitting: Internal Medicine

## 2023-05-13 ENCOUNTER — Encounter: Payer: Self-pay | Admitting: Internal Medicine

## 2023-05-13 VITALS — BP 110/60 | HR 76 | Temp 97.4°F | Ht 64.0 in | Wt 166.6 lb

## 2023-05-13 DIAGNOSIS — Z23 Encounter for immunization: Secondary | ICD-10-CM

## 2023-05-13 DIAGNOSIS — I2729 Other secondary pulmonary hypertension: Secondary | ICD-10-CM | POA: Diagnosis not present

## 2023-05-13 DIAGNOSIS — J479 Bronchiectasis, uncomplicated: Secondary | ICD-10-CM | POA: Diagnosis not present

## 2023-05-13 NOTE — Patient Instructions (Addendum)
Order- schedule overnight oximetry on room air      dx pulmonary hypertension  Order- flu vax senior  Please call if we can help

## 2023-05-19 ENCOUNTER — Telehealth: Payer: Self-pay | Admitting: Internal Medicine

## 2023-05-19 NOTE — Assessment & Plan Note (Signed)
Moderate pulmonary hypertension by echocardiogram in 2023, presumably reflecting underlying lung disease Plan-night oximetry on room air

## 2023-05-19 NOTE — Assessment & Plan Note (Signed)
Currently controlled without exacerbation. Plan-flu vaccine

## 2023-05-19 NOTE — Telephone Encounter (Signed)
Patient would like to know what a pulse oximetry overnight is. Also would like to know where the Zoila Shutter is located in the lungs. Patient phone number is [phone].

## 2023-05-21 NOTE — Telephone Encounter (Signed)
I answered PT quest on ONO but she still wonders where [facility] is in the lungs.  I think she is referring to MAC, a bacteria that can be inhaled and grow if a PT is compromised. Please call @ [phone]

## 2023-05-24 NOTE — Telephone Encounter (Signed)
She was originally diagnosed with Mycobacterium avium [facility]) infection when she lived in [place]. It is scattered through her airways, but has seemed stable. We are just watching it for now.

## 2023-05-25 NOTE — Telephone Encounter (Signed)
Patient's question answered. Nothing further needed. IMPRESSION: 1. Stable 3 mm right upper lobe pulmonary nodule considered definitively benign requiring no future imaging follow-up. 2. Areas of bronchiectasis and chronic postinfectious or inflammatory scarring redemonstrated, as above. 3. Mild air trapping indicative of small airways disease. 4. Severe tracheobronchomalacia. 5. Cardiomegaly with biatrial dilatation. 6. Aortic atherosclerosis. 7. There are calcifications of the aortic valve. Echocardiographic correlation for evaluation of potential valvular dysfunction may be warranted if clinically indicated.

## 2023-05-28 DIAGNOSIS — L570 Actinic keratosis: Secondary | ICD-10-CM | POA: Diagnosis not present

## 2023-05-28 DIAGNOSIS — L568 Other specified acute skin changes due to ultraviolet radiation: Secondary | ICD-10-CM | POA: Diagnosis not present

## 2023-05-28 DIAGNOSIS — X32XXXD Exposure to sunlight, subsequent encounter: Secondary | ICD-10-CM | POA: Diagnosis not present

## 2023-06-06 NOTE — Progress Notes (Unsigned)
Cardiology Office Note:  .   Date:  06/10/2023  ID:  [name], DOB 1941/05/26, MRN [id number] PCP: Jarrett Soho, PA-C  [facility] Providers Cardiologist:  Little Ishikawa, MD Electrophysiologist:  Will Jorja Loa, MD    History of Present Illness: .   Amanda Sims is a 82 y.o. female with a past medical history of PAF, chronic diastolic heart failure, hypertension, pulmonary hypertension.  Patient is followed by Dr. Bjorn Pippin, Dr. Elberta Fortis.  Presents today for 27-month follow-up appointment.  Per chart review, patient was first referred to Dr. Bjorn Pippin in 05/2022 for evaluation of atrial fibrillation.  Atrial fibrillation was diagnosed during a hospital admission for COVID in 04/2022.  Echocardiogram on 05/19/2022 showed EF 50-55%, no regional wall motion abnormalities, mild LVH, normal RV function, moderately elevated PA systolic pressure, mild mitral valve regurgitation, moderate-severe tricuspid valve regurgitation, severe biatrial enlargement.  Nuclear stress test on 07/01/2022 showed small area of ischemia in apical anterior wall (on Dr. Campbell Lerner review, appeared to have normal perfusion). Patient underwent successful cardioversion on 07/22/22.   Patient was seen by Dr. Bjorn Pippin on 08/12/22 in clinic. At that time, patient was back in afib.  Complained of shortness of breath, fatigue.  She was referred to EP and was seen by Dr. Elberta Fortis on 09/15/2022.  At that appointment, patient reported preferring rhythm control.  Ablation versus medical management was discussed.  Given mild prolonged QT, Tikosyn would not be an option.  Patient preferred to think things over at that time.  Patient was last seen by cardiology on 12/15/2022.  At that time, patient reported that her and her family decided to hold off on any interventions for her A-fib.  She had no cardiac awareness of her atrial fibrillation.  She remained on metoprolol, Eliquis.  Preferred to hold off follow-up  with Dr. Elberta Fortis as she was not ready to undergo any kind of procedure for A-fib.  Today, patient presents for a follow up appointment. She reports that her AFib was discovered during a hospital admission for COVID-19 last fall. Despite undergoing cardioversion, her AFib returned after a few days. She is currently on Eliquis twice daily for AFib management. More recently, she has been experiencing shortness of breath, particularly when walking. She mentions needing to rest during walks in her living facility due to breathlessness. She also reports experiencing wheezing at night. However, she denies any chest pain. She also reports lower extremity swelling, which she has been reluctant to manage with Lasix due to concerns about side effects and frequent urination.  The patient also mentions a recent bereavement, with her husband passing away a couple of months ago. She reports occasional chest tightness, particularly when trying to sleep or in the morning, which she attributes to emotional stress related to her loss. The episodes of chest tightness are brief (only last a few seconds) and have been occurring a couple of times a week for the last week or two. She does not have chest pain on exertion   The patient acknowledges a decline in balance and strength, for which she had previously received physical therapy. She is considering resuming physical therapy sessions. She denies any issues with dizziness or feeling like she is going to pass out. She also denies any bleeding issues related to her use of Eliquis            ROS: Denies chest pain on exertion, palpitations, dizziness, syncope, near syncope. Does have shortness of breath on exertion and ankle edema.  Studies Reviewed: .   Cardiac Studies & Procedures     STRESS TESTS  MYOCARDIAL PERFUSION IMAGING 07/01/2022  Narrative   Findings are consistent with a small area of ischemia. The study is low risk.   No ST deviation was noted.   LV  perfusion is abnormal. Defect 1: There is a small defect with mild reduction in uptake present in the apical anterior location(s) that is partially reversible. There is normal wall motion in the defect area. Consistent with ischemia.   Left ventricular function is normal. Nuclear stress EF: 59 %. The left ventricular ejection fraction is normal (55-65%). End diastolic cavity size is normal.   Prior study not available for comparison.   ECHOCARDIOGRAM  ECHOCARDIOGRAM COMPLETE 05/19/2022  Narrative ECHOCARDIOGRAM REPORT    Patient Name:   Amanda Sims Date of Exam: 05/19/2022 Medical Rec #:  [id number]        Height:       64.0 in Accession #:    [id number]       Weight:       155.0 lb Date of Birth:  [birth year]        BSA:          1.756 m Patient Age:    81 years         BP:           141/77 mmHg Patient Gender: F                HR:           66 bpm. Exam Location:  Inpatient  Procedure: 2D Echo, 3D Echo, Color Doppler and Cardiac Doppler  Indications:    A-Fib  History:        Patient has no prior history of Echocardiogram examinations. Arrythmias:Atrial Fibrillation.  Sonographer:    Aron Baba Referring Phys: [id number] Darlin Drop   Sonographer Comments: Patient is obese. Image acquisition challenging due to respiratory motion. IMPRESSIONS   1. Left ventricular ejection fraction, by estimation, is 50 to 55%. The left ventricle has low normal function. The left ventricle has no regional wall motion abnormalities. There is mild concentric left ventricular hypertrophy. Left ventricular diastolic parameters are indeterminate. 2. Right ventricular systolic function is normal. The right ventricular size is mildly enlarged. There is moderately elevated pulmonary artery systolic pressure. The estimated right ventricular systolic pressure is 55.2 mmHg. 3. Left atrial size was severely dilated. 4. Right atrial size was severely dilated. 5. The mitral valve is grossly normal. Mild  mitral valve regurgitation. 6. Tricuspid valve regurgitation is moderate to severe, during some RR intervals there is hepatic vein flow reversal. 7. The aortic valve is tricuspid. There is mild calcification of the aortic valve. There is mild thickening of the aortic valve. Aortic valve regurgitation is mild. Aortic valve sclerosis is present, with no evidence of aortic valve stenosis.  Comparison(s): No prior Echocardiogram.  FINDINGS Left Ventricle: Left ventricular ejection fraction, by estimation, is 50 to 55%. The left ventricle has low normal function. The left ventricle has no regional wall motion abnormalities. The left ventricular internal cavity size was normal in size. There is mild concentric left ventricular hypertrophy. Left ventricular diastolic parameters are indeterminate.  Right Ventricle: The right ventricular size is mildly enlarged. No increase in right ventricular wall thickness. Right ventricular systolic function is normal. There is moderately elevated pulmonary artery systolic pressure. The tricuspid regurgitant velocity is 3.17 m/s, and with an assumed right atrial pressure of 15 mmHg,  the estimated right ventricular systolic pressure is 55.2 mmHg.  Left Atrium: Left atrial size was severely dilated.  Right Atrium: Right atrial size was severely dilated.  Pericardium: There is no evidence of pericardial effusion.  Mitral Valve: MVA by 2D Planimetry 2.67 cm2. The mitral valve is grossly normal. Mild mitral valve regurgitation. The mean mitral valve gradient is 1.0 mmHg with average heart rate of 74 bpm.  Tricuspid Valve: The tricuspid valve is normal in structure. Tricuspid valve regurgitation is moderate to severe. No evidence of tricuspid stenosis.  Aortic Valve: The aortic valve is tricuspid. There is mild calcification of the aortic valve. There is mild thickening of the aortic valve. Aortic valve regurgitation is mild. Aortic valve sclerosis is present, with no  evidence of aortic valve stenosis.  Pulmonic Valve: The pulmonic valve was normal in structure. Pulmonic valve regurgitation is trivial. No evidence of pulmonic stenosis.  Aorta: The aortic root and ascending aorta are structurally normal, with no evidence of dilitation.  IAS/Shunts: No atrial level shunt detected by color flow Doppler.   LEFT VENTRICLE PLAX 2D LVIDd:         4.70 cm   Diastology LVIDs:         3.30 cm   LV e' medial:    7.30 cm/s LV PW:         1.00 cm   LV E/e' medial:  13.0 LV IVS:        1.10 cm   LV e' lateral:   10.90 cm/s LVOT diam:     1.80 cm   LV E/e' lateral: 8.7 LV SV:         45 LV SV Index:   26 LVOT Area:     2.54 cm  3D Volume EF: 3D EF:        49 % LV EDV:       64 ml LV ESV:       33 ml LV SV:        31 ml  RIGHT VENTRICLE RV S prime:     9.32 cm/s TAPSE (M-mode): 2.0 cm  LEFT ATRIUM           Index        RIGHT ATRIUM           Index LA diam:      4.20 cm 2.39 cm/m   RA Area:     36.30 cm LA Vol (A4C): 93.1 ml 53.03 ml/m  RA Volume:   122.00 ml 69.50 ml/m AORTIC VALVE LVOT Vmax:   90.70 cm/s LVOT Vmean:  60.100 cm/s LVOT VTI:    0.177 m  AORTA Ao Root diam: 3.30 cm Ao Asc diam:  3.20 cm  MITRAL VALVE               TRICUSPID VALVE MV Area (PHT): 6.96 cm    TR Peak grad:   40.2 mmHg MV Mean grad:  1.0 mmHg    TR Vmax:        317.00 cm/s MV Decel Time: 109 msec MV E velocity: 94.60 cm/s  SHUNTS MV A velocity: 31.70 cm/s  Systemic VTI:  0.18 m MV E/A ratio:  2.98        Systemic Diam: 1.80 cm  Riley Lam MD Electronically signed by Riley Lam MD Signature Date/Time: 05/19/2022/5:35:14 PM    Final             Risk Assessment/Calculations:    CHA2DS2-VASc Score = 5  This indicates a 7.2% annual risk of stroke. The patient's score is based upon: CHF History: 1 HTN History: 1 Diabetes History: 0 Stroke History: 0 Vascular Disease History: 0 Age Score: 2 Gender Score: 1       Physical Exam:    VS:  BP 124/68   Pulse 72   Ht 5\' 4"  (1.626 m)   Wt 169 lb 12.8 oz (77 kg)   SpO2 97%   BMI 29.15 kg/m    Wt Readings from Last 3 Encounters:  06/10/23 169 lb 12.8 oz (77 kg)  05/13/23 166 lb 9.6 oz (75.6 kg)  12/15/22 165 lb 9.6 oz (75.1 kg)    GEN: Well nourished, well developed in no acute distress. Sitting comfortably on the exam table  NECK: No JVD; No carotid bruits CARDIAC: Irregular rate and rhythm. no murmurs, rubs, gallops RESPIRATORY:  Crackles in bilateral lung bases, otherwise clear to auscultation. Normal work of breathing on room air  ABDOMEN: Soft, non-tender, non-distended EXTREMITIES:  1+ edema in BLE; No deformity   ASSESSMENT AND PLAN: .    Persistent Atrial Fibrillation  - Previously underwent successful cardioversion in 06/2022, had recurrence of atrial fibrillation in 07/2022. -Was evaluated by EP, discussed ablation versus antiarrhythmic medications.  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 - Of note, echo in 04/2022 showed severe biatrial enlargement. - Patient remains in atrial fibrillation. HR well controlled and patient is asymptomatic. She prefers to avoid procedures or start rhythm controlling medications at this time. I agree that this is reasonable as her afib is very well rate controlled and she is asymptomatic  -Continue metoprolol succinate 50 mg daily -Continue Eliquis 5 mg twice daily- this is correct dose for her   Chronic Diastolic Heart Failure  -Echocardiogram from 04/2022 showed EF 50 to 55%, no regional wall motion abnormalities, mild LVH, normal RV function, moderately elevated pulmonary artery systolic pressure - Patient has not been taking lasix because she is concerned that it may make her urinate. She is however, experiencing shortness of breath on exertion, ankle edema. Discussed trial of lasix 20 mg daily to see if symptoms improve and if she tolerates the increased  urination. Patient is in agreement  - Start lasix 20 mg daily  - Ordered BMP today. Also ordered BMP to be collected in 2 weeks  HTN  - BP well controlled. No dizziness or symptoms of orthostatic hypotension  -Continue amlodipine 2.5 mg daily, lisinopril 40 mg daily, metoprolol succinate 50 mg daily - BMP for monitoring   Chest discomfort - Patient reports occasional episodes of chest discomfort that occurs when she is in bed at night. Episodes are very brief, only last a few seconds at a time. Associated with emotional distress (she is grieving the recent loss of her husband). Denies chest pain on exertion  - Nuclear stress test from 06/2022 was low risk, normal Per Dr. Campbell Lerner review  - Overall, pain does not sound cardiac in nature. No indication for ischemic evaluation at this time      Dispo: Follow up in 6 months   Signed, Jonita Albee, PA-C

## 2023-06-10 ENCOUNTER — Ambulatory Visit: Payer: Medicare Other | Attending: Cardiology | Admitting: Cardiology

## 2023-06-10 ENCOUNTER — Encounter: Payer: Self-pay | Admitting: Cardiology

## 2023-06-10 VITALS — BP 124/68 | HR 72 | Ht 64.0 in | Wt 169.8 lb

## 2023-06-10 DIAGNOSIS — I480 Paroxysmal atrial fibrillation: Secondary | ICD-10-CM | POA: Diagnosis not present

## 2023-06-10 DIAGNOSIS — I5032 Chronic diastolic (congestive) heart failure: Secondary | ICD-10-CM

## 2023-06-10 DIAGNOSIS — I10 Essential (primary) hypertension: Secondary | ICD-10-CM | POA: Diagnosis not present

## 2023-06-10 DIAGNOSIS — R079 Chest pain, unspecified: Secondary | ICD-10-CM

## 2023-06-10 DIAGNOSIS — R609 Edema, unspecified: Secondary | ICD-10-CM

## 2023-06-10 MED ORDER — FUROSEMIDE 20 MG PO TABS: 20.0000 mg | ORAL_TABLET | Freq: Every day | ORAL | 1 refills | Status: DC

## 2023-06-10 NOTE — Patient Instructions (Addendum)
Medication Instructions:  Restart Lasix 20 mg once a day *If you need a refill on your cardiac medications before your next appointment, please call your pharmacy*   Lab Work: Today: BMP 2 weeks: BMP   Testing/Procedures: No testing   Follow-Up: At [facility], you and your health needs are our priority.  As part of our continuing mission to provide you with exceptional heart care, we have created designated Provider Care Teams.  These Care Teams include your primary Cardiologist (physician) and Advanced Practice Providers (APPs -  Physician Assistants and Nurse Practitioners) who all work together to provide you with the care you need, when you need it.  We recommend signing up for the patient portal called "MyChart".  Sign up information is provided on this After Visit Summary.  MyChart is used to connect with patients for Virtual Visits (Telemedicine).  Patients are able to view lab/test results, encounter notes, upcoming appointments, etc.  Non-urgent messages can be sent to your provider as well.   To learn more about what you can do with MyChart, go to [email or web address].    Your next appointment:   6 month(s)  Provider:   Any APP

## 2023-06-11 LAB — BASIC METABOLIC PANEL
BUN/Creatinine Ratio: 24 (ref 12–28)
BUN: 16 mg/dL (ref 8–27)
CO2: 28 mmol/L (ref 20–29)
Calcium: 9.4 mg/dL (ref 8.7–10.3)
Chloride: 96 mmol/L (ref 96–106)
Creatinine, Ser: 0.67 mg/dL (ref 0.57–1.00)
Glucose: 99 mg/dL (ref 70–99)
Potassium: 4.3 mmol/L (ref 3.5–5.2)
Sodium: 138 mmol/L (ref 134–144)
eGFR: 88 mL/min/{1.73_m2} (ref >59)

## 2023-07-01 DIAGNOSIS — R609 Edema, unspecified: Secondary | ICD-10-CM | POA: Diagnosis not present

## 2023-07-02 LAB — BASIC METABOLIC PANEL
BUN/Creatinine Ratio: 19 (ref 12–28)
BUN: 12 mg/dL (ref 8–27)
CO2: 29 mmol/L (ref 20–29)
Calcium: 9.1 mg/dL (ref 8.7–10.3)
Chloride: 97 mmol/L (ref 96–106)
Creatinine, Ser: 0.62 mg/dL (ref 0.57–1.00)
Glucose: 85 mg/dL (ref 70–99)
Potassium: 4.5 mmol/L (ref 3.5–5.2)
Sodium: 140 mmol/L (ref 134–144)
eGFR: 90 mL/min/{1.73_m2} (ref >59)

## 2023-08-02 DIAGNOSIS — R051 Acute cough: Secondary | ICD-10-CM | POA: Diagnosis not present

## 2023-08-02 DIAGNOSIS — U071 COVID-19: Secondary | ICD-10-CM | POA: Diagnosis not present

## 2023-08-02 DIAGNOSIS — R0902 Hypoxemia: Secondary | ICD-10-CM | POA: Diagnosis not present

## 2023-08-02 DIAGNOSIS — G4730 Sleep apnea, unspecified: Secondary | ICD-10-CM | POA: Diagnosis not present

## 2023-08-02 DIAGNOSIS — H6991 Unspecified Eustachian tube disorder, right ear: Secondary | ICD-10-CM | POA: Diagnosis not present

## 2023-08-11 ENCOUNTER — Other Ambulatory Visit: Payer: Self-pay | Admitting: Cardiology

## 2023-08-11 DIAGNOSIS — I4891 Unspecified atrial fibrillation: Secondary | ICD-10-CM

## 2023-08-12 ENCOUNTER — Telehealth: Payer: Self-pay | Admitting: Cardiology

## 2023-08-12 DIAGNOSIS — I4891 Unspecified atrial fibrillation: Secondary | ICD-10-CM

## 2023-08-12 MED ORDER — APIXABAN 5 MG PO TABS: 5.0000 mg | ORAL_TABLET | Freq: Two times a day (BID) | ORAL | 1 refills | Status: DC

## 2023-08-12 NOTE — Telephone Encounter (Signed)
Prescription refill request for Eliquis received. Indication: Afib Last office visit: 06/10/23 Laural Benes)  Scr: 0.62 (07/01/23)  Age: 82 Weight: 77kg  Appropriate dose. Refill sent.

## 2023-08-12 NOTE — Telephone Encounter (Signed)
Prescription refill request for Eliquis received. Indication: AF Last office visit: 06/10/23  K Johnson PA-C Scr: 0.62 on 07/01/23  Epic Age: 82 Weight: 77kg  Based on above findings Eliquis 5mg  twice daily is the appropriate dose.  Refill approved.

## 2023-08-12 NOTE — Telephone Encounter (Signed)
 *  STAT* If patient is at the pharmacy, call can be transferred to refill team.   1. Which medications need to be refilled? (please list name of each medication and dose if known)   ELIQUIS 5 MG TABS tablet   2. Which pharmacy/location (including street and city if local pharmacy) is medication to be sent to?  [id number] - [place], [place] - [address] AT [facility] [address]    3. Do they need a 30 day or 90 day supply?  90 day   Patient going out of town on Friday and needs refill before then

## 2023-09-10 ENCOUNTER — Ambulatory Visit: Payer: Medicare Other | Admitting: Cardiology

## 2023-10-04 ENCOUNTER — Telehealth: Payer: Self-pay | Admitting: Internal Medicine

## 2023-10-04 DIAGNOSIS — G4734 Idiopathic sleep related nonobstructive alveolar hypoventilation: Secondary | ICD-10-CM

## 2023-10-04 NOTE — Telephone Encounter (Signed)
 Overnight oximetry on room air on 08/02/23 showed significant desaturation with very unstable pulse.  We are ordering a repeat, before decision to order home O2.

## 2023-10-18 ENCOUNTER — Encounter: Payer: Self-pay | Admitting: Internal Medicine

## 2023-10-18 DIAGNOSIS — R2681 Unsteadiness on feet: Secondary | ICD-10-CM | POA: Diagnosis not present

## 2023-10-18 DIAGNOSIS — R2689 Other abnormalities of gait and mobility: Secondary | ICD-10-CM | POA: Diagnosis not present

## 2023-10-27 DIAGNOSIS — R2689 Other abnormalities of gait and mobility: Secondary | ICD-10-CM | POA: Diagnosis not present

## 2023-10-27 DIAGNOSIS — R2681 Unsteadiness on feet: Secondary | ICD-10-CM | POA: Diagnosis not present

## 2023-10-28 DIAGNOSIS — D225 Melanocytic nevi of trunk: Secondary | ICD-10-CM | POA: Diagnosis not present

## 2023-10-28 DIAGNOSIS — L570 Actinic keratosis: Secondary | ICD-10-CM | POA: Diagnosis not present

## 2023-10-28 DIAGNOSIS — X32XXXD Exposure to sunlight, subsequent encounter: Secondary | ICD-10-CM | POA: Diagnosis not present

## 2023-10-28 DIAGNOSIS — L308 Other specified dermatitis: Secondary | ICD-10-CM | POA: Diagnosis not present

## 2023-11-03 DIAGNOSIS — R2689 Other abnormalities of gait and mobility: Secondary | ICD-10-CM | POA: Diagnosis not present

## 2023-11-03 DIAGNOSIS — R2681 Unsteadiness on feet: Secondary | ICD-10-CM | POA: Diagnosis not present

## 2023-11-09 DIAGNOSIS — R2681 Unsteadiness on feet: Secondary | ICD-10-CM | POA: Diagnosis not present

## 2023-11-09 DIAGNOSIS — R2689 Other abnormalities of gait and mobility: Secondary | ICD-10-CM | POA: Diagnosis not present

## 2023-11-11 DIAGNOSIS — I10 Essential (primary) hypertension: Secondary | ICD-10-CM | POA: Diagnosis not present

## 2023-11-15 DIAGNOSIS — R2681 Unsteadiness on feet: Secondary | ICD-10-CM | POA: Diagnosis not present

## 2023-11-15 DIAGNOSIS — R2689 Other abnormalities of gait and mobility: Secondary | ICD-10-CM | POA: Diagnosis not present

## 2023-11-18 NOTE — Progress Notes (Signed)
 Cardiology Office Note:  .   Date:  12/02/2023  ID:  Amanda Sims, DOB 02-17-1941, MRN [id number] PCP: Amanda Soho, PA-C  Amanda Sims Providers Cardiologist:  Little Ishikawa, MD Electrophysiologist:  Will Jorja Loa, MD  History of Present Illness: .   Amanda Sims is a 83 y.o. female with a past medical history of PAF, chronic diastolic heart failure, hypertension, pulmonary hypertension.  Patient is followed by Dr. Bjorn Pippin, Dr. Elberta Fortis.  Presents today for 74-month follow-up appointment.   Per chart review, patient was first referred to Dr. Bjorn Pippin in 05/2022 for evaluation of atrial fibrillation.  Atrial fibrillation was diagnosed during a hospital admission for COVID in 04/2022.  Echocardiogram on 05/19/2022 showed EF 50-55%, no regional wall motion abnormalities, mild LVH, normal RV function, moderately elevated PA systolic pressure, mild mitral valve regurgitation, moderate-severe tricuspid valve regurgitation, severe biatrial enlargement.  Nuclear stress test on 07/01/2022 showed small area of ischemia in apical anterior wall (on Dr. Campbell Lerner review, appeared to have normal perfusion). Patient underwent successful cardioversion on 07/22/22.    Patient was seen by Dr. Bjorn Pippin on 08/12/22 in clinic. At that time, patient was back in afib.  Complained of shortness of breath, fatigue.  She was referred to EP and was seen by Dr. Elberta Fortis on 09/15/2022.  At that appointment, patient reported preferring rhythm control.  Ablation versus medical management was discussed.  Given mild prolonged QT, Tikosyn would not be an option.  Patient preferred to think things over at that time.   Patient was again seen by cardiology in clinic on 12/15/2022.  At that time, patient reported that her and her family decided to hold off on any interventions for her A-fib.  She had no cardiac awareness of her atrial fibrillation.  She remained on metoprolol, Eliquis.  Preferred to hold off  follow-up with Dr. Elberta Fortis as she was not ready to undergo any kind of procedure for A-fib. I saw her in clinic on 06/10/23. At that time, she remained in atrial fibrillation. HR was well controlled and she was asymptomatic. Preferred to continue rate control strategy.   Today patient presents for a routine follow-up. She reports that in March, she had a period of high blood pressure. This was noted during physical therapy and led to an increase in her amlodipine dosage to 5mg  twice daily. Since the adjustment, she reports her blood pressure has been well controlled. She denies any swelling or dizziness. Denies syncope or near syncope.   Patient reports some shortness of breath on exertion, specifically if she tries to walk a far distance too quickly. If she walks short distances or moves at a slower pace, her breathing is stable. She recently had a repeat oximetry test through her pulmonologist, but results are not yet available. She denies any issues with breathing when lying flat. Denies cough, lower extremity swelling.   She also reports a brief episode of heart fluttering about a month ago. Denies palpitations otherwise. Denies any rapid heart rate. She is currently on Eliquis for her atrial fibrillation and has not had any issues with bleeding.  ROS: Per HPI   Studies Reviewed: .    Cardiac Studies & Procedures   ______________________________________________________________________________________________   STRESS TESTS  MYOCARDIAL PERFUSION IMAGING 07/01/2022  Narrative   Findings are consistent with a small area of ischemia. The study is low risk.   No ST deviation was noted.   LV perfusion is abnormal. Defect 1: There is a small defect with mild  reduction in uptake present in the apical anterior location(s) that is partially reversible. There is normal wall motion in the defect area. Consistent with ischemia.   Left ventricular function is normal. Nuclear stress EF: 59 %. The left  ventricular ejection fraction is normal (55-65%). End diastolic cavity size is normal.   Prior study not available for comparison.   ECHOCARDIOGRAM  ECHOCARDIOGRAM COMPLETE 05/19/2022  Narrative ECHOCARDIOGRAM REPORT    Patient Name:   Amanda Sims Date of Exam: 05/19/2022 Medical Rec #:  [id number]        Height:       64.0 in Accession #:    [id number]       Weight:       155.0 lb Date of Birth:  02-07-1941        BSA:          1.756 m Patient Age:    81 years         BP:           141/77 mmHg Patient Gender: F                HR:           66 bpm. Exam Location:  Inpatient  Procedure: 2D Echo, 3D Echo, Color Doppler and Cardiac Doppler  Indications:    A-Fib  History:        Patient has no prior history of Echocardiogram examinations. Arrythmias:Atrial Fibrillation.  Sonographer:    Aron Baba Referring Phys: [id number] Darlin Drop   Sonographer Comments: Patient is obese. Image acquisition challenging due to respiratory motion. IMPRESSIONS   1. Left ventricular ejection fraction, by estimation, is 50 to 55%. The left ventricle has low normal function. The left ventricle has no regional wall motion abnormalities. There is mild concentric left ventricular hypertrophy. Left ventricular diastolic parameters are indeterminate. 2. Right ventricular systolic function is normal. The right ventricular size is mildly enlarged. There is moderately elevated pulmonary artery systolic pressure. The estimated right ventricular systolic pressure is 55.2 mmHg. 3. Left atrial size was severely dilated. 4. Right atrial size was severely dilated. 5. The mitral valve is grossly normal. Mild mitral valve regurgitation. 6. Tricuspid valve regurgitation is moderate to severe, during some RR intervals there is hepatic vein flow reversal. 7. The aortic valve is tricuspid. There is mild calcification of the aortic valve. There is mild thickening of the aortic valve. Aortic valve regurgitation is  mild. Aortic valve sclerosis is present, with no evidence of aortic valve stenosis.  Comparison(s): No prior Echocardiogram.  FINDINGS Left Ventricle: Left ventricular ejection fraction, by estimation, is 50 to 55%. The left ventricle has low normal function. The left ventricle has no regional wall motion abnormalities. The left ventricular internal cavity size was normal in size. There is mild concentric left ventricular hypertrophy. Left ventricular diastolic parameters are indeterminate.  Right Ventricle: The right ventricular size is mildly enlarged. No increase in right ventricular wall thickness. Right ventricular systolic function is normal. There is moderately elevated pulmonary artery systolic pressure. The tricuspid regurgitant velocity is 3.17 m/s, and with an assumed right atrial pressure of 15 mmHg, the estimated right ventricular systolic pressure is 55.2 mmHg.  Left Atrium: Left atrial size was severely dilated.  Right Atrium: Right atrial size was severely dilated.  Pericardium: There is no evidence of pericardial effusion.  Mitral Valve: MVA by 2D Planimetry 2.67 cm2. The mitral valve is grossly normal. Mild mitral valve regurgitation. The mean mitral valve gradient  is 1.0 mmHg with average heart rate of 74 bpm.  Tricuspid Valve: The tricuspid valve is normal in structure. Tricuspid valve regurgitation is moderate to severe. No evidence of tricuspid stenosis.  Aortic Valve: The aortic valve is tricuspid. There is mild calcification of the aortic valve. There is mild thickening of the aortic valve. Aortic valve regurgitation is mild. Aortic valve sclerosis is present, with no evidence of aortic valve stenosis.  Pulmonic Valve: The pulmonic valve was normal in structure. Pulmonic valve regurgitation is trivial. No evidence of pulmonic stenosis.  Aorta: The aortic root and ascending aorta are structurally normal, with no evidence of dilitation.  IAS/Shunts: No atrial level  shunt detected by color flow Doppler.   LEFT VENTRICLE PLAX 2D LVIDd:         4.70 cm   Diastology LVIDs:         3.30 cm   LV e' medial:    7.30 cm/s LV PW:         1.00 cm   LV E/e' medial:  13.0 LV IVS:        1.10 cm   LV e' lateral:   10.90 cm/s LVOT diam:     1.80 cm   LV E/e' lateral: 8.7 LV SV:         45 LV SV Index:   26 LVOT Area:     2.54 cm  3D Volume EF: 3D EF:        49 % LV EDV:       64 ml LV ESV:       33 ml LV SV:        31 ml  RIGHT VENTRICLE RV S prime:     9.32 cm/s TAPSE (M-mode): 2.0 cm  LEFT ATRIUM           Index        RIGHT ATRIUM           Index LA diam:      4.20 cm 2.39 cm/m   RA Area:     36.30 cm LA Vol (A4C): 93.1 ml 53.03 ml/m  RA Volume:   122.00 ml 69.50 ml/m AORTIC VALVE LVOT Vmax:   90.70 cm/s LVOT Vmean:  60.100 cm/s LVOT VTI:    0.177 m  AORTA Ao Root diam: 3.30 cm Ao Asc diam:  3.20 cm  MITRAL VALVE               TRICUSPID VALVE MV Area (PHT): 6.96 cm    TR Peak grad:   40.2 mmHg MV Mean grad:  1.0 mmHg    TR Vmax:        317.00 cm/s MV Decel Time: 109 msec MV E velocity: 94.60 cm/s  SHUNTS MV A velocity: 31.70 cm/s  Systemic VTI:  0.18 m MV E/A ratio:  2.98        Systemic Diam: 1.80 cm  Riley Lam MD Electronically signed by Riley Lam MD Signature Date/Time: 05/19/2022/5:35:14 PM    Final          ______________________________________________________________________________________________      Risk Assessment/Calculations:    CHA2DS2-VASc Score = 5   This indicates a 7.2% annual risk of stroke. The patient's score is based upon: CHF History: 1 HTN History: 1 Diabetes History: 0 Stroke History: 0 Vascular Disease History: 0 Age Score: 2 Gender Score: 1   Physical Exam:   VS:  BP 120/76 (BP Location: Left Arm, Patient Position: Sitting, Cuff Size: Normal)  Pulse 76   Ht 5\' 4"  (1.626 m)   Wt 169 lb (76.7 kg)   SpO2 94%   BMI 29.01 kg/m    Wt Readings from Last 3  Encounters:  12/02/23 169 lb (76.7 kg)  06/10/23 169 lb 12.8 oz (77 kg)  05/13/23 166 lb 9.6 oz (75.6 kg)    GEN: Well nourished, well developed in no acute distress. Sitting comfortably in the chair  NECK: No JVD CARDIAC: irregular rate and rhythm, no murmurs, rubs, gallops. Radial pulses 2+ bilaterally  RESPIRATORY:  Clear to auscultation without rales, wheezing or rhonchi. Normal WOB on room air  ABDOMEN: Soft, non-tender, non-distended EXTREMITIES:  No edema in BLE. No deformity   ASSESSMENT AND PLAN: .    Persistent Atrial Fibrillation  - Previously underwent successful cardioversion in 06/2022, had recurrence of atrial fibrillation in 07/2022. -Was evaluated by EP, discussed ablation versus antiarrhythmic medications.  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 Revisited this today, patient reports feeling well on her current dose of metoprolol. Prefers to continue rate control  - Of note, echo in 04/2022 showed severe biatrial enlargement. - HR has been well controlled. Denies palpitations, tachycardia. She is unable to feel her afib  -Continue metoprolol succinate 50 mg daily -Continue Eliquis 5 mg twice daily- this is correct dose for her. She denies bleeding on eliquis    Chronic Diastolic Heart Failure  -Echocardiogram from 04/2022 showed EF 50 to 55%, no regional wall motion abnormalities, mild LVH, normal RV function, moderately elevated pulmonary artery systolic pressure - Has some shortness of breath when walking too fast, but otherwise breathing has been OK. No orthopnea, lower extremity swelling. Euvolemic on exam  - Creatinine 0.62 and K 4.5 in 06/2023  - Continue lasix 20 mg daily. Prefers to avoid higher doses due to frequent urination    HTN  - BP well controlled. No dizziness or symptoms of orthostatic hypotension  -Continue amlodipine 5mg  BID, lisinopril 40 mg daily, metoprolol succinate 50 mg  daily      Dispo: Follow up in 6 months with Dr. Bjorn Pippin   Signed, Jonita Albee, PA-C

## 2023-11-22 ENCOUNTER — Telehealth: Payer: Self-pay

## 2023-11-22 DIAGNOSIS — G4734 Idiopathic sleep related nonobstructive alveolar hypoventilation: Secondary | ICD-10-CM

## 2023-11-22 NOTE — Telephone Encounter (Signed)
 Patient states that she had a sleep study but was told she had to have an additional sleep study for more information. She states that they are wanting her to drive to [facility] to get the test but she isnt going to do that. She isnt comfortable doing that and wants to know if it is absolutley necessary for her health to have this additional testing.     Copied from CRM [id number]. Topic: Appointments - Appointment Scheduling >> Nov 22, 2023 11:16 AM Renie Ora wrote: Patient is calling regarding an appointment. Patient stated she did not want to schedule at this time, patient stated she previously had a sleep test done and the company who did the sleep test was not doable for her due to the location, and she would like to know if she has another sleep test done can it be done at another location. Patient stated if not, she would like to schedule an appointment at that time.

## 2023-11-23 NOTE — Telephone Encounter (Signed)
 Left message on VM.  Patient will need to get a second ONO.  [facility] Overnight Pulse-Oximetry Report faxed over last ONO (on room air) from 08/02/2023.  Per Dr. Maple Hudson result note from 10/04/2023 telephone encounter:  Overnight oximetry on room air on 08/02/23 showed significant desaturation with very unstable pulse.  We are ordering a repeat, before decision to order home O2.  Waiting for patient to call back to give information.  Will schedule a repeat ONO (on room air) per Dr. Maple Hudson.

## 2023-11-23 NOTE — Telephone Encounter (Signed)
 Copied from CRM [id number]. Topic: Clinical - Lab/Test Results >> Nov 23, 2023  4:10 PM Orinda Kenner C wrote: Reason for CRM: Patient [phone] returning the office call on sleep test results. Please advise and call back.  Patient returning call for office. Advised pt of results from ONO 08/02/23. Pt verbalizing understanding with performing repeat ONO. FYI Amy pt is aware. Nothing further needed.

## 2023-11-23 NOTE — Telephone Encounter (Signed)
 Spoke with Amanda Sims. Pt is concerned if she needs to have another ONO done, pt claims she never received results from Dr. Maple Hudson and would like to know how her test was.   Spoke with [employer], they are faxing over results. Please advise Dr. Maple Hudson

## 2023-11-24 NOTE — Telephone Encounter (Signed)
 Called patient.  Reviewed ONO results from 08/01/2024 ([employer]).  Informed patient Dr. Maple Hudson wants her to have a second ONO (on room air) as first test results showed an unstable pulse .  New ONO (on room air) has been ordered.  Patient will wait for [facility] team to call her for appointment time.

## 2023-11-25 DIAGNOSIS — H903 Sensorineural hearing loss, bilateral: Secondary | ICD-10-CM | POA: Diagnosis not present

## 2023-11-29 ENCOUNTER — Telehealth: Payer: Self-pay

## 2023-11-29 NOTE — Telephone Encounter (Signed)
 Will route encounter to [facility] team to check on status of ONO as patient states [employer] has not contacted the patient as of today.  ONO was ordered on 11/23/2023.  [facility] team, please advise.  Thank you.

## 2023-11-29 NOTE — Telephone Encounter (Signed)
 Amanda Sims, Amanda Sims, Raven N; [place], [place], we have a note in that we spoke to the patient on Friday and she is picking up the ONO today

## 2023-11-29 NOTE — Telephone Encounter (Signed)
 Copied from CRM [id number]. Topic: Clinical - Medical Advice >> Nov 26, 2023  2:46 PM Chantha C wrote: Reason for CRM: Patient was advised to do another Overnight Pulse-Oximetry, however they have not contact patient. Patient states this happen before and it toke weeks to get a hold of someone. Patient wants to know [employer] [facility] phone number, provided [facility] [phone].    Patient wanted the office to know, please advise and call back at [phone].

## 2023-11-30 DIAGNOSIS — R2681 Unsteadiness on feet: Secondary | ICD-10-CM | POA: Diagnosis not present

## 2023-11-30 DIAGNOSIS — R2689 Other abnormalities of gait and mobility: Secondary | ICD-10-CM | POA: Diagnosis not present

## 2023-12-01 DIAGNOSIS — R0902 Hypoxemia: Secondary | ICD-10-CM | POA: Diagnosis not present

## 2023-12-02 ENCOUNTER — Encounter: Payer: Self-pay | Admitting: Cardiology

## 2023-12-02 ENCOUNTER — Ambulatory Visit: Attending: Cardiology | Admitting: Cardiology

## 2023-12-02 VITALS — BP 120/76 | HR 76 | Ht 64.0 in | Wt 169.0 lb

## 2023-12-02 DIAGNOSIS — I4819 Other persistent atrial fibrillation: Secondary | ICD-10-CM

## 2023-12-02 DIAGNOSIS — I5032 Chronic diastolic (congestive) heart failure: Secondary | ICD-10-CM | POA: Diagnosis not present

## 2023-12-02 DIAGNOSIS — I10 Essential (primary) hypertension: Secondary | ICD-10-CM

## 2023-12-02 NOTE — Patient Instructions (Signed)
 Medication Instructions:  NO CHANGES *If you need a refill on your cardiac medications before your next appointment, please call your pharmacy*  Lab Work: NO LABS If you have labs (blood work) drawn today and your tests are completely normal, you will receive your results only by: MyChart Message (if you have MyChart) OR A paper copy in the mail If you have any lab test that is abnormal or we need to change your treatment, we will call you to review the results.  Testing/Procedures: NO TESTING  Follow-Up: At [facility], you and your health needs are our priority.  As part of our continuing mission to provide you with exceptional heart care, our providers are all part of one team.  This team includes your primary Cardiologist (physician) and Advanced Practice Providers or APPs (Physician Assistants and Nurse Practitioners) who all work together to provide you with the care you need, when you need it.  Your next appointment:   6 month(s)  Provider:   Little Ishikawa, MD   We recommend signing up for the patient portal called "MyChart".  Sign up information is provided on this After Visit Summary.  MyChart is used to connect with patients for Virtual Visits (Telemedicine).  Patients are able to view lab/test results, encounter notes, upcoming appointments, etc.  Non-urgent messages can be sent to your provider as well.   To learn more about what you can do with MyChart, go to [email or web address].   Other Instructions   1st Floor: - Lobby - Registration  - Pharmacy  - Lab - Cafe  2nd Floor: - PV Lab - Diagnostic Testing (echo, CT, nuclear med)  3rd Floor: - Vacant  4th Floor: - TCTS (cardiothoracic surgery) - AFib Clinic - Structural Heart Clinic - Vascular Surgery  - Vascular Ultrasound  5th Floor: - HeartCare Cardiology (general and EP) - Clinical Pharmacy for coumadin, hypertension, lipid, weight-loss medications, and med management  appointments    Valet parking services will be available as well.

## 2023-12-03 ENCOUNTER — Telehealth: Payer: Self-pay | Admitting: Internal Medicine

## 2023-12-03 DIAGNOSIS — L820 Inflamed seborrheic keratosis: Secondary | ICD-10-CM | POA: Diagnosis not present

## 2023-12-03 DIAGNOSIS — G4734 Idiopathic sleep related nonobstructive alveolar hypoventilation: Secondary | ICD-10-CM

## 2023-12-03 DIAGNOSIS — L728 Other follicular cysts of the skin and subcutaneous tissue: Secondary | ICD-10-CM | POA: Diagnosis not present

## 2023-12-03 DIAGNOSIS — L821 Other seborrheic keratosis: Secondary | ICD-10-CM | POA: Diagnosis not present

## 2023-12-03 DIAGNOSIS — X32XXXD Exposure to sunlight, subsequent encounter: Secondary | ICD-10-CM | POA: Diagnosis not present

## 2023-12-03 DIAGNOSIS — L570 Actinic keratosis: Secondary | ICD-10-CM | POA: Diagnosis not present

## 2023-12-03 NOTE — Telephone Encounter (Signed)
 Documentation of Overnight oximetry- I have not called her. Overnight oximetry from 11/30/23  again shows significant time ( 56 minutes total) with oxygen saturation at 88% or less.  Recommend we ORDER- new DME, new Home O2 2L for sleep.  Dx Nocturnal Hypoxemia

## 2023-12-03 NOTE — Telephone Encounter (Signed)
 Copied from CRM [id number]. Topic: Clinical - Medical Advice >> Dec 03, 2023 10:27 AM Isabell A wrote: Reason for CRM: Patient would like to discuss oximetry test. Callback number: [phone]   Spoke with Karsten Ro regarding results. Pt verbalized understanding.  Overnight oximetry from 11/30/23  again shows significant time ( 56 minutes total) with oxygen saturation at 88% or less.   Recommend we ORDER- new DME, new Home O2 2L for sleep.  Dx Nocturnal Hypoxemia  DME oxygen order has been placed. NFN

## 2023-12-06 NOTE — Telephone Encounter (Signed)
 Pt would ike a call back to discuss new DME equipment. How long iss this going to take for new equipment. [phone]

## 2023-12-07 NOTE — Telephone Encounter (Signed)
 Called patient. Reviewed information per Dr. Linder Revere.  Patient is going out of town on 12/16/2023 and wanted to make sure she received her oxygen for night time use before she leaves.  Will send message to [facility] team.

## 2023-12-07 NOTE — Telephone Encounter (Signed)
 Obtained ONO results from [facility] and emailed to patricia from [employer]  NFN

## 2023-12-07 NOTE — Telephone Encounter (Signed)
 Amanda Sims; [name]; [name], [name]; [name], [name] We are needing ONO results for this pt to qualify her. Thanks!

## 2023-12-09 ENCOUNTER — Telehealth: Payer: Self-pay

## 2023-12-09 ENCOUNTER — Encounter: Payer: Self-pay | Admitting: Internal Medicine

## 2023-12-09 NOTE — Telephone Encounter (Signed)
 Copied from CRM [id number]. Topic: Appointments - Scheduling Inquiry for Clinic >> Dec 09, 2023  3:26 PM Juliana Ocean wrote: Reason for CRM: Maggie from [facility] calling b/c the pt's insurance wants her to be seen before they will supple O2. Pt needs an appt first. Insurance needs notes for why pt needs O2     Patient had second ONO on 11/30/2023.  Patients last OV with Dr. Linder Revere was 05/13/2023.  Called patient to make an OV.  Scheduled patient for 12/13/2023 at 11:30 am.  Patient verbalized understanding.

## 2023-12-12 NOTE — Progress Notes (Signed)
 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 Her pulmonologist in [place]  followed her conservatively. There was another brief episode of self-limited hemoptysis in March of 2012. She has had bronchoscopy x2. CT chest without contrast on 02/15/2011 showed areas of reticular nodularity and scarring in the right middle lobe, PFT 07/09/2011-mild obstructive airways disease with minimal response to bronchodilator. Air-trapping confirming obstruction. Normal diffusion. FEV1 1.84/89%, FEV1/FVC 0.71, FEF 25-75% 1.06/45% after bronchodilator. Office Spirometry 11/22/2015-mild obstructive airways disease. FVC 2.14/76%, FEV1 1.49/70%, FEV1/FVC 0.70, FEF 25-75 percent 0.85/46%. Overnight oximetry from 11/30/23 again shows significant time ( 56 minutes total) with oxygen saturation at 88% or less.   -----------------------------------------------------------------------------------------------------------        05/13/23- 83 year old female never smoker with MAIC, Bronchiectasis( Dx 2011/ [place] ),/ COPD, Lung Nodule,  hemoptysis, DOE, complicated by Obesity, HTN, AFib/Eliquis , PHTN by Echo,  She is aware of occasional wheeze mostly at night, lying down, but says it is not bothering her.  She feels controlled.  Not much productive cough.  Not much dry cough despite lisinopril. Recently lost husband. We are going to check overnight oximetry. ECHO 2023- 50-55%, RAE, Mod PHN, no aortic stenosis CT chest 05/10/23 IMPRESSION: 1. Stable 3 mm right upper lobe pulmonary nodule considered definitively benign requiring no future imaging follow-up. 2. Areas of bronchiectasis and chronic postinfectious or inflammatory scarring redemonstrated, as above. 3. Mild air trapping indicative of small airways disease. 4. Severe tracheobronchomalacia. 5.  Cardiomegaly with biatrial dilatation. 6. Aortic atherosclerosis. 7. There are calcifications of the aortic valve. Echocardiographic correlation for evaluation of potential valvular dysfunction may be warranted if clinically indicated. Aortic Atherosclerosis (ICD10-I70.0).  12/13/23     83 year old female never smoker with MAIC, Bronchiectasis( Dx 2011/ [place] ),/ COPD, Lung Nodule,  hemoptysis, DOE, Nocturnal Hypoxemia, complicated by Obesity, HTN, AFib/Eliquis , PHTN by Echo,  Overnight oximetry from 11/30/23 again shows significant time ( 56 minutes total) with oxygen saturation at 88% or less.  O2 for sleep ordered 2L.(Sent to [facility] out of network.) Discussed the use of AI scribe software for clinical note transcription with the patient, who gave verbal consent to proceed.  History of Present Illness   The patient, with a history of nocturnal hypoxemia, presents with increased daytime sleepiness, often sleeping for extended periods in the afternoon. This has been occurring a few times a week and appears to be increasing in frequency. She also reports shortness of breath after walking approximately 600 feet at a moderate pace. She has a dry cough, producing only a small amount of clear phlegm, which is relieved by cough drops. She denies night sweats.  The patient recently completed an overnight oximetry test, which showed that she spent about 56 minutes with an oxygen saturation of 88% or less. This is concerning for persistent nocturnal hypoxemia.     Assessment and Plan:    Nocturnal Hypoxemia Confirmed by overnight oximetry with significant desaturation. Increases risk of cardiovascular and cerebral complications. Home oxygen therapy agreed upon. - Initiate home oxygen therapy with [facility] at 2 L/min. - Coordinate with home care company for oxygen concentrator activation. - Advise starting therapy post-[place]  trip. - Follow-up in three months to assess therapy  effectiveness.  Daytime Fatigue Increased fatigue and dyspnea potentially linked to nocturnal hypoxemia. Oxygen therapy impact to be evaluated before further diagnostics. - Evaluate impact of oxygen therapy on fatigue and  dyspnea. - Consider sleep study if symptoms persist despite therapy.     Bronchiectasis Old MAIC but without significant active progression -Plan- follow  ROS-see HPI     += positive  Constitutional:   No-   weight loss, +night sweats,  No-fevers, chills, fatigue, lassitude. HEENT:   No-  headaches, difficulty swallowing, tooth/dental problems, sore throat,                  No-  sneezing, itching, ear ache, nasal congestion, post nasal drip,  CV:  No-   chest pain, orthopnea, PND, swelling in lower extremities, anasarca, dizziness, palpitations Resp: +shortness of breath with exertion or at rest.             productive cough,  No non-productive cough,  No- coughing up of blood.              No-   change in color of mucus.  No- wheezing.   Skin: No-   rash or lesions. GI:  No-   heartburn, indigestion, abdominal pain, nausea, vomiting,  GU: MS:  No-   joint pain or swelling.  Neuro-     nothing unusual Psych:  No- change in mood or affect. No depression or anxiety.  No memory loss.   OBJ     General- Alert, Oriented, Affect-appropriate, Distress- none acute; well-appearing.     Skin- rash-none, lesions- none, excoriation- none Lymphadenopathy- none Head- atraumatic            Eyes- Gross vision intact, PERRLA, conjunctivae clear secretions            Ears-+ mild hard of hearing            Nose- Clear, no-Septal dev, mucus, polyps, erosion, perforation             Throat- Mallampati II , mucosa clear , drainage- none, tonsils- atrophic Neck- flexible , trachea midline, no stridor , thyroid nl, carotid no bruit Chest - symmetrical excursion , unlabored           Heart/CV- RRR/ occ extras , no murmur , no gallop  , no rub, nl s1 s2                           - JVD-  none , edema- none, stasis changes- none, varices- none           Lung- clear to P&A, wheeze- none, cough- none , dullness-none, rub- none           Chest wall-  Abd-  Br/ Gen/ Rectal- Not done, not indicated Extrem- cyanosis- none, clubbing, none, atrophy- none, strength- nl, +spider veins Neuro- grossly intact to observation

## 2023-12-13 ENCOUNTER — Encounter: Payer: Self-pay | Admitting: Internal Medicine

## 2023-12-13 ENCOUNTER — Ambulatory Visit (INDEPENDENT_AMBULATORY_CARE_PROVIDER_SITE_OTHER): Admitting: Internal Medicine

## 2023-12-13 VITALS — BP 138/82 | HR 72 | Temp 97.4°F | Resp 18 | Ht 64.0 in | Wt 169.4 lb

## 2023-12-13 DIAGNOSIS — G4710 Hypersomnia, unspecified: Secondary | ICD-10-CM | POA: Diagnosis not present

## 2023-12-13 DIAGNOSIS — J479 Bronchiectasis, uncomplicated: Secondary | ICD-10-CM

## 2023-12-13 DIAGNOSIS — G4734 Idiopathic sleep related nonobstructive alveolar hypoventilation: Secondary | ICD-10-CM

## 2023-12-13 NOTE — Patient Instructions (Signed)
 We will find out where things stand with [facility]  who should be contacting you about starting home oxygen at night at 2L for a diagnosis of Nocturnal Hypoxemia.  Please cal if we can help.

## 2023-12-14 NOTE — Telephone Encounter (Addendum)
 Called patient.  Patient states [facility] reached out to her and has set up a date to bring her oxygen out to her.  Patient is going out of town and requested [facility] bring the oxygen to her home on 12/25/2023.  Informed Dr. Linder Revere.  Will close encounter.

## 2023-12-17 ENCOUNTER — Telehealth: Payer: Self-pay

## 2023-12-17 NOTE — Telephone Encounter (Signed)
 Tried calling pt in regards as to why they were coming and left a voice message,referred to last telephone note where it states a request for [facility] to bring o2 on May 3rd,in notes with appointment to dr,hunsucker it says o2 orders and is scheduled as a acute visit,will call back

## 2023-12-20 ENCOUNTER — Ambulatory Visit: Admitting: Pulmonary Disease

## 2023-12-22 ENCOUNTER — Encounter: Payer: Self-pay | Admitting: Internal Medicine

## 2023-12-28 NOTE — Telephone Encounter (Signed)
 Copied from CRM [id number]. Topic: General - Other >> Dec 27, 2023 11:15 AM Chantha C wrote: Reason for CRM: Patient states there's 4 tanks and a portable oxygen machine, there was a lot of stuff from [facility]. Patient just wants to be sure that all of the is necessary was ordered from Dr. Linder Revere or from [facility]? Please advise and call back at [phone].     Left message on patients VM to get more details as to what patient's message is.

## 2024-01-03 NOTE — Telephone Encounter (Signed)
 ATC X2. LMTCB. Completing note per protocol after I send Mychart message. NFN

## 2024-01-04 ENCOUNTER — Telehealth: Payer: Self-pay | Admitting: Internal Medicine

## 2024-01-04 NOTE — Telephone Encounter (Signed)
 ATC X1. LMTCB

## 2024-01-04 NOTE — Telephone Encounter (Signed)
 PT states she got an O2 order delivered. She just wanted a back up tank if the power went out. She got delivered 3 extra smaller tanks. Can she get one big one? Please call. Thanks. (She also got a 4th tank on a roller/carrier.) Call ASAP. Delivery guy will be there soon.

## 2024-01-04 NOTE — Telephone Encounter (Signed)
 Called patient.  Patient wanted to know how many tanks we ordered from [facility] to come out to patients home.  Informed patient [facility] brings a certain number of tanks according to the order from the provider.  Patients order from Dr. Linder Revere per AVS:  We will find out where things stand with [facility] who should be contacting you about starting home oxygen at night at 2L for a diagnosis of Nocturnal Hypoxemia.  Patient states she will contact [facility] to pick up any equipment that she feels like she will not use like the portable cart with wheels .  Patient states nothing further needed at this time.

## 2024-01-04 NOTE — Telephone Encounter (Signed)
 Pt calling back at 11:58 am.  She states she has been waiting for a call back that has never happened. Pt declined to give any further info, she wants to speak directly to the CMA.  Would like a callback between 2 pm - 5 pm. Pt did tell me it concerns returning some of the O2. It is taking up too much space.  Cb [phone]

## 2024-01-05 ENCOUNTER — Telehealth: Payer: Self-pay

## 2024-01-05 NOTE — Telephone Encounter (Signed)
 I called and spoke to pt. Pt states she did not think that she needed extra tanks with her as it took up much space. I informed pt that this was to insure she did not run out and simply not have oxygen to get right back on. Pt verbalized understanding and stated she has slept better since on the o2. Pt stated she will keep the extra tanks and continue to use nocturnal o2. NFN

## 2024-01-11 NOTE — Telephone Encounter (Signed)
 I handled this on 01-05-24. nfn

## 2024-02-01 ENCOUNTER — Other Ambulatory Visit: Payer: Self-pay | Admitting: Cardiology

## 2024-02-01 DIAGNOSIS — I4891 Unspecified atrial fibrillation: Secondary | ICD-10-CM

## 2024-02-02 NOTE — Telephone Encounter (Signed)
 Eliquis  5mg  refill request received. Patient is 83 years old, weight-76.8kg, Crea-0.62 on 07/01/23, Diagnosis-Afib, and last seen by Liane Redman on 12/02/23. Dose is appropriate based on dosing criteria. Will send in refill to requested pharmacy.

## 2024-03-01 ENCOUNTER — Telehealth: Payer: Self-pay | Admitting: Cardiology

## 2024-03-01 MED ORDER — FUROSEMIDE 20 MG PO TABS: 20.0000 mg | ORAL_TABLET | Freq: Every day | ORAL | 2 refills | Status: DC

## 2024-03-01 NOTE — Telephone Encounter (Signed)
*  STAT* If patient is at the pharmacy, call can be transferred to refill team.   1. Which medications need to be refilled? (please list name of each medication and dose if known)   furosemide (LASIX) 20 MG tablet    4. Which pharmacy/location (including street and city if local pharmacy) is medication to be sent to?[id number] - [place], [place] - [address] AT [facility] OF [address]    5. Do they need a 30 day or 90 day supply? 90

## 2024-03-01 NOTE — Telephone Encounter (Signed)
 Pt's medication was sent to pt's pharmacy as requested. Confirmation received.

## 2024-03-22 NOTE — Telephone Encounter (Signed)
 Encounter created in error

## 2024-03-23 DIAGNOSIS — J449 Chronic obstructive pulmonary disease, unspecified: Secondary | ICD-10-CM | POA: Diagnosis not present

## 2024-03-23 DIAGNOSIS — J479 Bronchiectasis, uncomplicated: Secondary | ICD-10-CM | POA: Diagnosis not present

## 2024-03-27 DIAGNOSIS — L821 Other seborrheic keratosis: Secondary | ICD-10-CM | POA: Diagnosis not present

## 2024-03-27 DIAGNOSIS — I781 Nevus, non-neoplastic: Secondary | ICD-10-CM | POA: Diagnosis not present

## 2024-03-27 DIAGNOSIS — L820 Inflamed seborrheic keratosis: Secondary | ICD-10-CM | POA: Diagnosis not present

## 2024-04-11 DIAGNOSIS — E559 Vitamin D deficiency, unspecified: Secondary | ICD-10-CM | POA: Diagnosis not present

## 2024-04-11 DIAGNOSIS — E7800 Pure hypercholesterolemia, unspecified: Secondary | ICD-10-CM | POA: Diagnosis not present

## 2024-04-11 DIAGNOSIS — M8580 Other specified disorders of bone density and structure, unspecified site: Secondary | ICD-10-CM | POA: Diagnosis not present

## 2024-04-11 DIAGNOSIS — Z0000 Encounter for general adult medical examination without abnormal findings: Secondary | ICD-10-CM | POA: Diagnosis not present

## 2024-04-11 DIAGNOSIS — I5032 Chronic diastolic (congestive) heart failure: Secondary | ICD-10-CM | POA: Diagnosis not present

## 2024-04-11 DIAGNOSIS — I10 Essential (primary) hypertension: Secondary | ICD-10-CM | POA: Diagnosis not present

## 2024-04-11 DIAGNOSIS — J479 Bronchiectasis, uncomplicated: Secondary | ICD-10-CM | POA: Diagnosis not present

## 2024-04-23 DIAGNOSIS — J479 Bronchiectasis, uncomplicated: Secondary | ICD-10-CM | POA: Diagnosis not present

## 2024-04-23 DIAGNOSIS — J449 Chronic obstructive pulmonary disease, unspecified: Secondary | ICD-10-CM | POA: Diagnosis not present

## 2024-04-25 ENCOUNTER — Other Ambulatory Visit: Payer: Self-pay | Admitting: Cardiology

## 2024-05-17 NOTE — Progress Notes (Unsigned)
 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 Her pulmonologist in [place]  followed her conservatively. There was another brief episode of self-limited hemoptysis in March of 2012. She has had bronchoscopy x2. CT chest without contrast on 02/15/2011 showed areas of reticular nodularity and scarring in the right middle lobe, PFT 07/09/2011-mild obstructive airways disease with minimal response to bronchodilator. Air-trapping confirming obstruction. Normal diffusion. FEV1 1.84/89%, FEV1/FVC 0.71, FEF 25-75% 1.06/45% after bronchodilator. Office Spirometry 11/22/2015-mild obstructive airways disease. FVC 2.14/76%, FEV1 1.49/70%, FEV1/FVC 0.70, FEF 25-75 percent 0.85/46%. Overnight oximetry from 11/30/23 again shows significant time ( 56 minutes total) with oxygen saturation at 88% or less.   -----------------------------------------------------------------------------------------------------------   12/13/23     83 year old female never smoker with MAIC, Bronchiectasis( Dx 2011/ [place] ),/ COPD, Lung Nodule,  hemoptysis, DOE, Nocturnal Hypoxemia, complicated by Obesity, HTN, AFib/Eliquis , PHTN by Echo,  Overnight oximetry from 11/30/23 again shows significant time ( 56 minutes total) with oxygen saturation at 88% or less.  O2 for sleep ordered 2L.(Sent to [facility] out of network.) Discussed the use of AI scribe software for clinical note transcription with the patient, who gave verbal consent to proceed.  History of Present Illness   The patient, with a history of nocturnal hypoxemia, presents with increased daytime sleepiness, often sleeping for extended periods in the afternoon. This has been occurring a few times a week and appears to be increasing in frequency. She also reports shortness of breath after walking approximately 600 feet  at a moderate pace. She has a dry cough, producing only a small amount of clear phlegm, which is relieved by cough drops. She denies night sweats.  The patient recently completed an overnight oximetry test, which showed that she spent about 56 minutes with an oxygen saturation of 88% or less. This is concerning for persistent nocturnal hypoxemia.     Assessment and Plan:    Nocturnal Hypoxemia Confirmed by overnight oximetry with significant desaturation. Increases risk of cardiovascular and cerebral complications. Home oxygen therapy agreed upon. - Initiate home oxygen therapy with [facility] at 2 L/min. - Coordinate with home care company for oxygen concentrator activation. - Advise starting therapy post-[place]  trip. - Follow-up in three months to assess therapy effectiveness.  Daytime Fatigue Increased fatigue and dyspnea potentially linked to nocturnal hypoxemia. Oxygen therapy impact to be evaluated before further diagnostics. - Evaluate impact of oxygen therapy on fatigue and dyspnea. - Consider sleep study if symptoms persist despite therapy.     Bronchiectasis Old MAIC but without significant active progression -Plan- follow    05/18/24- 83 year old female never smoker with MAIC, Bronchiectasis( Dx 2011/ [place] ),/ COPD, Lung Nodule,  hemoptysis, DOE, Nocturnal Hypoxemia, complicated by Obesity, HTN, AFib/Eliquis , PHTN by Echo, Home O2 sleep 2l ordered 12/13/23      ....     ROS-see HPI     += positive  Constitutional:   No-   weight loss, +night sweats,  No-fevers, chills, fatigue, lassitude. HEENT:   No-  headaches, difficulty swallowing, tooth/dental problems, sore throat,                  No-  sneezing, itching, ear ache, nasal congestion, post nasal drip,  CV:  No-   chest pain, orthopnea, PND, swelling in lower extremities, anasarca, dizziness, palpitations Resp: +shortness of breath with exertion or at rest.  productive cough,  No non-productive cough,   No- coughing up of blood.              No-   change in color of mucus.  No- wheezing.   Skin: No-   rash or lesions. GI:  No-   heartburn, indigestion, abdominal pain, nausea, vomiting,  GU: MS:  No-   joint pain or swelling.  Neuro-     nothing unusual Psych:  No- change in mood or affect. No depression or anxiety.  No memory loss.   OBJ     General- Alert, Oriented, Affect-appropriate, Distress- none acute; well-appearing.     Skin- rash-none, lesions- none, excoriation- none Lymphadenopathy- none Head- atraumatic            Eyes- Gross vision intact, PERRLA, conjunctivae clear secretions            Ears-+ mild hard of hearing            Nose- Clear, no-Septal dev, mucus, polyps, erosion, perforation             Throat- Mallampati II , mucosa clear , drainage- none, tonsils- atrophic Neck- flexible , trachea midline, no stridor , thyroid nl, carotid no bruit Chest - symmetrical excursion , unlabored           Heart/CV- RRR/ occ extras , no murmur , no gallop  , no rub, nl s1 s2                           - JVD- none , edema- none, stasis changes- none, varices- none           Lung- clear to P&A, wheeze- none, cough- none , dullness-none, rub- none           Chest wall-  Abd-  Br/ Gen/ Rectal- Not done, not indicated Extrem- cyanosis- none, clubbing, none, atrophy- none, strength- nl, +spider veins Neuro- grossly intact to observation

## 2024-05-18 ENCOUNTER — Encounter: Payer: Self-pay | Admitting: Internal Medicine

## 2024-05-18 ENCOUNTER — Ambulatory Visit: Admitting: Internal Medicine

## 2024-05-18 VITALS — BP 136/83 | HR 76 | Ht 64.0 in | Wt 171.0 lb

## 2024-05-18 DIAGNOSIS — R400 Somnolence: Secondary | ICD-10-CM | POA: Diagnosis not present

## 2024-05-18 DIAGNOSIS — J479 Bronchiectasis, uncomplicated: Secondary | ICD-10-CM | POA: Diagnosis not present

## 2024-05-18 DIAGNOSIS — Z23 Encounter for immunization: Secondary | ICD-10-CM | POA: Diagnosis not present

## 2024-05-18 DIAGNOSIS — I4891 Unspecified atrial fibrillation: Secondary | ICD-10-CM

## 2024-05-18 DIAGNOSIS — G4734 Idiopathic sleep related nonobstructive alveolar hypoventilation: Secondary | ICD-10-CM | POA: Diagnosis not present

## 2024-05-18 NOTE — Patient Instructions (Signed)
 Order- flu vax- senior  You can ask [facility] if they have a branch near where you will be living in [place] .  You can ask [facility] if you can ship your oxygen concentrator down there when you move.

## 2024-05-19 DIAGNOSIS — G4734 Idiopathic sleep related nonobstructive alveolar hypoventilation: Secondary | ICD-10-CM | POA: Insufficient documentation

## 2024-05-19 NOTE — Assessment & Plan Note (Signed)
 Continue sleep O2 2L/ [facility]

## 2024-05-19 NOTE — Assessment & Plan Note (Signed)
 Good control Plan- Flu vax today

## 2024-05-19 NOTE — Assessment & Plan Note (Signed)
 Chronic or paroxysmal. Exam today feels like AFib with good rate control, vs RSR with frequent extras. Plan- Cardiology continue to manage

## 2024-05-23 DIAGNOSIS — J449 Chronic obstructive pulmonary disease, unspecified: Secondary | ICD-10-CM | POA: Diagnosis not present

## 2024-05-23 DIAGNOSIS — J479 Bronchiectasis, uncomplicated: Secondary | ICD-10-CM | POA: Diagnosis not present

## 2024-06-20 ENCOUNTER — Other Ambulatory Visit: Payer: Self-pay | Admitting: Cardiology

## 2024-07-28 ENCOUNTER — Telehealth: Payer: Self-pay | Admitting: Cardiology

## 2024-07-28 DIAGNOSIS — I4891 Unspecified atrial fibrillation: Secondary | ICD-10-CM

## 2024-07-28 NOTE — Telephone Encounter (Signed)
 Pt aware she may not get a full supply. She has moved to [facility] and can't make an appt

## 2024-07-28 NOTE — Telephone Encounter (Signed)
 Pt c/o medication issue:  1. Name of Medication:   furosemide  (LASIX ) 20 MG tablet    2. How are you currently taking this medication (dosage and times per day)? Not taking it   3. Are you having a reaction (difficulty breathing--STAT)? No   4. What is your medication issue? Pt called in stating she has not been taking this med and her legs aren't swollen. She asked if she needs to keep this med or can she disregard?

## 2024-07-28 NOTE — Telephone Encounter (Signed)
*  STAT* If patient is at the pharmacy, call can be transferred to refill team.   1. Which medications need to be refilled? (please list name of each medication and dose if known)   ELIQUIS  5 MG TABS tablet    4. Which pharmacy/location (including street and city if local pharmacy) is medication to be sent to?  [id number] - [place], [place] - [address] AT [facility] OF [address] Phone: [phone]  Fax: [phone]       5. Do they need a 30 day or 90 day supply? 90

## 2024-07-31 MED ORDER — FUROSEMIDE 20 MG PO TABS: 20.0000 mg | ORAL_TABLET | Freq: Every day | ORAL | Status: AC | PRN

## 2024-07-31 MED ORDER — APIXABAN 5 MG PO TABS: 5.0000 mg | ORAL_TABLET | Freq: Two times a day (BID) | ORAL | 0 refills | Status: AC

## 2024-07-31 NOTE — Telephone Encounter (Signed)
 Called and spoke to pt regarding the Lasix . She states the last time she took this medication was a few weeks ago. She has noticed her ankles/legs are not swollen. She has felt fine and in reality was only taking this medication as needed and not on a daily basis. She has moved to [place]  and is planning to transfer care there. Has a PCP appointment on 08/30/24.

## 2024-07-31 NOTE — Telephone Encounter (Signed)
 Left detailed message for the patient okay to take furosemide  or lasix  as needed.

## 2024-07-31 NOTE — Telephone Encounter (Signed)
 Prescription refill request for Eliquis  received. Indication: AF Last office visit: 12/02/23  K Johnson PA-C Scr: 0.62 on 07/01/23 Age: 83 Weight: 76.7kg  Based on above findings Eliquis  5mg  twice daily is the appropriate dose.  Pt has moved to [place]  and is transferring care there.  She has appt to establish with PCP on 08/30/24. Refill approvedx 1 only.  Future refills will need to come from new MD.
